# Patient Record
Sex: Female | Born: 1945 | Race: White | Hispanic: No | Marital: Married | Smoking: Never smoker
Health system: Southern US, Community
[De-identification: ages and names within clinical notes are randomized; demographics above are authoritative.]

## PROBLEM LIST (undated history)

## (undated) DIAGNOSIS — I639 Cerebral infarction, unspecified: Secondary | ICD-10-CM

## (undated) DIAGNOSIS — E039 Hypothyroidism, unspecified: Secondary | ICD-10-CM

## (undated) DIAGNOSIS — Z87820 Personal history of traumatic brain injury: Secondary | ICD-10-CM

## (undated) DIAGNOSIS — G43909 Migraine, unspecified, not intractable, without status migrainosus: Secondary | ICD-10-CM

## (undated) DIAGNOSIS — I10 Essential (primary) hypertension: Secondary | ICD-10-CM

## (undated) DIAGNOSIS — R5382 Chronic fatigue, unspecified: Secondary | ICD-10-CM

## (undated) DIAGNOSIS — G9332 Myalgic encephalomyelitis/chronic fatigue syndrome: Secondary | ICD-10-CM

## (undated) HISTORY — DX: Hypothyroidism, unspecified: E03.9

## (undated) HISTORY — DX: Myalgic encephalomyelitis/chronic fatigue syndrome: G93.32

## (undated) HISTORY — DX: Personal history of traumatic brain injury: Z87.820

## (undated) HISTORY — DX: Essential (primary) hypertension: I10

## (undated) HISTORY — DX: Chronic fatigue, unspecified: R53.82

---

## 1983-01-04 DIAGNOSIS — F411 Generalized anxiety disorder: Secondary | ICD-10-CM | POA: Insufficient documentation

## 2007-11-04 HISTORY — PX: BACK SURGERY: SHX140

## 2009-01-03 HISTORY — PX: REPLACEMENT TOTAL KNEE BILATERAL: SUR1225

## 2009-09-03 HISTORY — PX: CATARACT EXTRACTION, BILATERAL: SHX1313

## 2010-12-04 HISTORY — PX: ABDOMINAL HYSTERECTOMY: SHX81

## 2011-01-04 HISTORY — PX: ABDOMINAL HYSTERECTOMY: SHX81

## 2011-02-23 DIAGNOSIS — H43819 Vitreous degeneration, unspecified eye: Secondary | ICD-10-CM | POA: Diagnosis not present

## 2011-03-09 DIAGNOSIS — N951 Menopausal and female climacteric states: Secondary | ICD-10-CM | POA: Diagnosis not present

## 2011-03-11 DIAGNOSIS — E039 Hypothyroidism, unspecified: Secondary | ICD-10-CM | POA: Diagnosis not present

## 2011-03-24 DIAGNOSIS — F411 Generalized anxiety disorder: Secondary | ICD-10-CM | POA: Diagnosis not present

## 2011-03-24 DIAGNOSIS — I10 Essential (primary) hypertension: Secondary | ICD-10-CM | POA: Diagnosis not present

## 2011-03-24 DIAGNOSIS — E039 Hypothyroidism, unspecified: Secondary | ICD-10-CM | POA: Diagnosis not present

## 2011-03-31 DIAGNOSIS — R0789 Other chest pain: Secondary | ICD-10-CM | POA: Diagnosis not present

## 2011-03-31 DIAGNOSIS — I6350 Cerebral infarction due to unspecified occlusion or stenosis of unspecified cerebral artery: Secondary | ICD-10-CM | POA: Diagnosis not present

## 2011-03-31 DIAGNOSIS — E7800 Pure hypercholesterolemia, unspecified: Secondary | ICD-10-CM | POA: Diagnosis not present

## 2011-03-31 DIAGNOSIS — I10 Essential (primary) hypertension: Secondary | ICD-10-CM | POA: Diagnosis not present

## 2011-06-20 DIAGNOSIS — R262 Difficulty in walking, not elsewhere classified: Secondary | ICD-10-CM | POA: Diagnosis not present

## 2011-06-20 DIAGNOSIS — M25569 Pain in unspecified knee: Secondary | ICD-10-CM | POA: Diagnosis not present

## 2011-06-22 DIAGNOSIS — R262 Difficulty in walking, not elsewhere classified: Secondary | ICD-10-CM | POA: Diagnosis not present

## 2011-06-22 DIAGNOSIS — M25569 Pain in unspecified knee: Secondary | ICD-10-CM | POA: Diagnosis not present

## 2011-06-28 DIAGNOSIS — J069 Acute upper respiratory infection, unspecified: Secondary | ICD-10-CM | POA: Diagnosis not present

## 2011-06-28 DIAGNOSIS — N390 Urinary tract infection, site not specified: Secondary | ICD-10-CM | POA: Diagnosis not present

## 2011-06-28 DIAGNOSIS — R300 Dysuria: Secondary | ICD-10-CM | POA: Diagnosis not present

## 2011-07-06 DIAGNOSIS — N390 Urinary tract infection, site not specified: Secondary | ICD-10-CM | POA: Diagnosis not present

## 2011-07-06 DIAGNOSIS — J069 Acute upper respiratory infection, unspecified: Secondary | ICD-10-CM | POA: Diagnosis not present

## 2011-08-10 DIAGNOSIS — N390 Urinary tract infection, site not specified: Secondary | ICD-10-CM | POA: Diagnosis not present

## 2011-08-10 DIAGNOSIS — F411 Generalized anxiety disorder: Secondary | ICD-10-CM | POA: Diagnosis not present

## 2011-08-10 DIAGNOSIS — R300 Dysuria: Secondary | ICD-10-CM | POA: Diagnosis not present

## 2011-08-11 DIAGNOSIS — Z981 Arthrodesis status: Secondary | ICD-10-CM | POA: Diagnosis not present

## 2011-08-11 DIAGNOSIS — M545 Low back pain: Secondary | ICD-10-CM | POA: Diagnosis not present

## 2011-08-11 DIAGNOSIS — M79609 Pain in unspecified limb: Secondary | ICD-10-CM | POA: Diagnosis not present

## 2011-08-15 DIAGNOSIS — N390 Urinary tract infection, site not specified: Secondary | ICD-10-CM | POA: Diagnosis not present

## 2011-08-16 DIAGNOSIS — M5126 Other intervertebral disc displacement, lumbar region: Secondary | ICD-10-CM | POA: Diagnosis not present

## 2011-08-16 DIAGNOSIS — M5137 Other intervertebral disc degeneration, lumbosacral region: Secondary | ICD-10-CM | POA: Diagnosis not present

## 2011-08-16 DIAGNOSIS — M47817 Spondylosis without myelopathy or radiculopathy, lumbosacral region: Secondary | ICD-10-CM | POA: Diagnosis not present

## 2011-09-09 DIAGNOSIS — M5137 Other intervertebral disc degeneration, lumbosacral region: Secondary | ICD-10-CM | POA: Diagnosis not present

## 2011-09-16 DIAGNOSIS — I10 Essential (primary) hypertension: Secondary | ICD-10-CM | POA: Diagnosis not present

## 2011-09-16 DIAGNOSIS — I6350 Cerebral infarction due to unspecified occlusion or stenosis of unspecified cerebral artery: Secondary | ICD-10-CM | POA: Diagnosis not present

## 2011-09-16 DIAGNOSIS — E7800 Pure hypercholesterolemia, unspecified: Secondary | ICD-10-CM | POA: Diagnosis not present

## 2011-09-16 DIAGNOSIS — R0789 Other chest pain: Secondary | ICD-10-CM | POA: Diagnosis not present

## 2011-09-16 DIAGNOSIS — R002 Palpitations: Secondary | ICD-10-CM | POA: Diagnosis not present

## 2011-09-19 DIAGNOSIS — N390 Urinary tract infection, site not specified: Secondary | ICD-10-CM | POA: Diagnosis not present

## 2011-09-19 DIAGNOSIS — R300 Dysuria: Secondary | ICD-10-CM | POA: Diagnosis not present

## 2011-09-20 DIAGNOSIS — N390 Urinary tract infection, site not specified: Secondary | ICD-10-CM | POA: Diagnosis not present

## 2011-09-21 DIAGNOSIS — N390 Urinary tract infection, site not specified: Secondary | ICD-10-CM | POA: Diagnosis not present

## 2011-10-12 DIAGNOSIS — R339 Retention of urine, unspecified: Secondary | ICD-10-CM | POA: Diagnosis not present

## 2011-10-12 DIAGNOSIS — R350 Frequency of micturition: Secondary | ICD-10-CM | POA: Diagnosis not present

## 2011-10-12 DIAGNOSIS — N390 Urinary tract infection, site not specified: Secondary | ICD-10-CM | POA: Diagnosis not present

## 2011-10-17 DIAGNOSIS — R350 Frequency of micturition: Secondary | ICD-10-CM | POA: Diagnosis not present

## 2011-10-17 DIAGNOSIS — R351 Nocturia: Secondary | ICD-10-CM | POA: Diagnosis not present

## 2011-10-17 DIAGNOSIS — R339 Retention of urine, unspecified: Secondary | ICD-10-CM | POA: Diagnosis not present

## 2011-10-17 DIAGNOSIS — N390 Urinary tract infection, site not specified: Secondary | ICD-10-CM | POA: Diagnosis not present

## 2011-11-16 DIAGNOSIS — R339 Retention of urine, unspecified: Secondary | ICD-10-CM | POA: Diagnosis not present

## 2011-11-16 DIAGNOSIS — N390 Urinary tract infection, site not specified: Secondary | ICD-10-CM | POA: Diagnosis not present

## 2011-12-16 DIAGNOSIS — N390 Urinary tract infection, site not specified: Secondary | ICD-10-CM | POA: Diagnosis not present

## 2012-01-18 DIAGNOSIS — N390 Urinary tract infection, site not specified: Secondary | ICD-10-CM | POA: Diagnosis not present

## 2012-01-18 DIAGNOSIS — R339 Retention of urine, unspecified: Secondary | ICD-10-CM | POA: Diagnosis not present

## 2012-02-16 DIAGNOSIS — E039 Hypothyroidism, unspecified: Secondary | ICD-10-CM | POA: Diagnosis not present

## 2012-02-16 DIAGNOSIS — F411 Generalized anxiety disorder: Secondary | ICD-10-CM | POA: Diagnosis not present

## 2012-03-22 DIAGNOSIS — E039 Hypothyroidism, unspecified: Secondary | ICD-10-CM | POA: Diagnosis not present

## 2012-03-22 DIAGNOSIS — I10 Essential (primary) hypertension: Secondary | ICD-10-CM | POA: Diagnosis not present

## 2012-03-26 DIAGNOSIS — N390 Urinary tract infection, site not specified: Secondary | ICD-10-CM | POA: Diagnosis not present

## 2012-03-26 DIAGNOSIS — E039 Hypothyroidism, unspecified: Secondary | ICD-10-CM | POA: Diagnosis not present

## 2012-04-05 DIAGNOSIS — R0609 Other forms of dyspnea: Secondary | ICD-10-CM | POA: Diagnosis not present

## 2012-04-05 DIAGNOSIS — E871 Hypo-osmolality and hyponatremia: Secondary | ICD-10-CM | POA: Diagnosis not present

## 2012-04-05 DIAGNOSIS — R5383 Other fatigue: Secondary | ICD-10-CM | POA: Diagnosis not present

## 2012-04-05 DIAGNOSIS — E039 Hypothyroidism, unspecified: Secondary | ICD-10-CM | POA: Diagnosis not present

## 2012-04-05 DIAGNOSIS — I10 Essential (primary) hypertension: Secondary | ICD-10-CM | POA: Diagnosis not present

## 2012-04-05 DIAGNOSIS — R5381 Other malaise: Secondary | ICD-10-CM | POA: Diagnosis not present

## 2012-04-05 DIAGNOSIS — R0989 Other specified symptoms and signs involving the circulatory and respiratory systems: Secondary | ICD-10-CM | POA: Diagnosis not present

## 2012-04-19 DIAGNOSIS — K06020 Generalized gingival recession, unspecified: Secondary | ICD-10-CM | POA: Diagnosis not present

## 2012-04-19 DIAGNOSIS — R5381 Other malaise: Secondary | ICD-10-CM | POA: Diagnosis not present

## 2012-04-19 DIAGNOSIS — G4733 Obstructive sleep apnea (adult) (pediatric): Secondary | ICD-10-CM | POA: Diagnosis not present

## 2012-04-19 DIAGNOSIS — Z87440 Personal history of urinary (tract) infections: Secondary | ICD-10-CM | POA: Diagnosis not present

## 2012-04-19 DIAGNOSIS — E039 Hypothyroidism, unspecified: Secondary | ICD-10-CM | POA: Diagnosis not present

## 2012-04-19 DIAGNOSIS — I10 Essential (primary) hypertension: Secondary | ICD-10-CM | POA: Diagnosis not present

## 2012-04-19 DIAGNOSIS — R5383 Other fatigue: Secondary | ICD-10-CM | POA: Diagnosis not present

## 2012-04-30 DIAGNOSIS — I10 Essential (primary) hypertension: Secondary | ICD-10-CM | POA: Diagnosis not present

## 2012-05-03 DIAGNOSIS — Z87820 Personal history of traumatic brain injury: Secondary | ICD-10-CM

## 2012-05-03 HISTORY — DX: Personal history of traumatic brain injury: Z87.820

## 2012-05-09 DIAGNOSIS — N390 Urinary tract infection, site not specified: Secondary | ICD-10-CM | POA: Diagnosis not present

## 2012-05-14 DIAGNOSIS — S0180XA Unspecified open wound of other part of head, initial encounter: Secondary | ICD-10-CM | POA: Diagnosis not present

## 2012-05-14 DIAGNOSIS — R51 Headache: Secondary | ICD-10-CM | POA: Diagnosis not present

## 2012-05-14 DIAGNOSIS — Z5320 Procedure and treatment not carried out because of patient's decision for unspecified reasons: Secondary | ICD-10-CM | POA: Diagnosis not present

## 2012-05-14 DIAGNOSIS — S0280XA Fracture of other specified skull and facial bones, unspecified side, initial encounter for closed fracture: Secondary | ICD-10-CM | POA: Diagnosis not present

## 2012-05-14 DIAGNOSIS — S0003XA Contusion of scalp, initial encounter: Secondary | ICD-10-CM | POA: Diagnosis not present

## 2012-05-14 DIAGNOSIS — S1093XA Contusion of unspecified part of neck, initial encounter: Secondary | ICD-10-CM | POA: Diagnosis not present

## 2012-05-14 DIAGNOSIS — T148XXA Other injury of unspecified body region, initial encounter: Secondary | ICD-10-CM | POA: Diagnosis not present

## 2012-05-15 DIAGNOSIS — S0100XA Unspecified open wound of scalp, initial encounter: Secondary | ICD-10-CM | POA: Diagnosis not present

## 2012-05-15 DIAGNOSIS — Z23 Encounter for immunization: Secondary | ICD-10-CM | POA: Diagnosis not present

## 2012-05-15 DIAGNOSIS — W010XXA Fall on same level from slipping, tripping and stumbling without subsequent striking against object, initial encounter: Secondary | ICD-10-CM | POA: Diagnosis not present

## 2012-05-15 DIAGNOSIS — S020XXA Fracture of vault of skull, initial encounter for closed fracture: Secondary | ICD-10-CM | POA: Diagnosis not present

## 2012-05-31 DIAGNOSIS — W010XXA Fall on same level from slipping, tripping and stumbling without subsequent striking against object, initial encounter: Secondary | ICD-10-CM | POA: Diagnosis not present

## 2012-05-31 DIAGNOSIS — S020XXA Fracture of vault of skull, initial encounter for closed fracture: Secondary | ICD-10-CM | POA: Diagnosis not present

## 2012-06-06 DIAGNOSIS — S020XXA Fracture of vault of skull, initial encounter for closed fracture: Secondary | ICD-10-CM | POA: Diagnosis not present

## 2012-06-06 DIAGNOSIS — R51 Headache: Secondary | ICD-10-CM | POA: Diagnosis not present

## 2012-06-06 DIAGNOSIS — I10 Essential (primary) hypertension: Secondary | ICD-10-CM | POA: Diagnosis not present

## 2012-06-06 DIAGNOSIS — R5383 Other fatigue: Secondary | ICD-10-CM | POA: Diagnosis not present

## 2012-06-06 DIAGNOSIS — R5381 Other malaise: Secondary | ICD-10-CM | POA: Diagnosis not present

## 2012-06-06 DIAGNOSIS — F0781 Postconcussional syndrome: Secondary | ICD-10-CM | POA: Diagnosis not present

## 2012-06-13 DIAGNOSIS — R51 Headache: Secondary | ICD-10-CM | POA: Diagnosis not present

## 2012-06-20 DIAGNOSIS — F0781 Postconcussional syndrome: Secondary | ICD-10-CM | POA: Diagnosis not present

## 2012-06-20 DIAGNOSIS — I10 Essential (primary) hypertension: Secondary | ICD-10-CM | POA: Diagnosis not present

## 2012-06-20 DIAGNOSIS — R5381 Other malaise: Secondary | ICD-10-CM | POA: Diagnosis not present

## 2012-06-20 DIAGNOSIS — S020XXA Fracture of vault of skull, initial encounter for closed fracture: Secondary | ICD-10-CM | POA: Diagnosis not present

## 2012-07-04 DIAGNOSIS — E559 Vitamin D deficiency, unspecified: Secondary | ICD-10-CM | POA: Diagnosis not present

## 2012-07-04 DIAGNOSIS — E785 Hyperlipidemia, unspecified: Secondary | ICD-10-CM | POA: Diagnosis not present

## 2012-07-04 DIAGNOSIS — I10 Essential (primary) hypertension: Secondary | ICD-10-CM | POA: Diagnosis not present

## 2012-07-04 DIAGNOSIS — E039 Hypothyroidism, unspecified: Secondary | ICD-10-CM | POA: Diagnosis not present

## 2012-07-04 DIAGNOSIS — R300 Dysuria: Secondary | ICD-10-CM | POA: Diagnosis not present

## 2012-07-25 DIAGNOSIS — N390 Urinary tract infection, site not specified: Secondary | ICD-10-CM | POA: Diagnosis not present

## 2012-07-25 DIAGNOSIS — R339 Retention of urine, unspecified: Secondary | ICD-10-CM | POA: Diagnosis not present

## 2012-09-17 DIAGNOSIS — F0781 Postconcussional syndrome: Secondary | ICD-10-CM | POA: Diagnosis not present

## 2012-09-17 DIAGNOSIS — G43009 Migraine without aura, not intractable, without status migrainosus: Secondary | ICD-10-CM | POA: Diagnosis not present

## 2012-09-17 DIAGNOSIS — R5382 Chronic fatigue, unspecified: Secondary | ICD-10-CM | POA: Diagnosis not present

## 2012-09-27 DIAGNOSIS — H26499 Other secondary cataract, unspecified eye: Secondary | ICD-10-CM | POA: Diagnosis not present

## 2012-09-27 DIAGNOSIS — G43809 Other migraine, not intractable, without status migrainosus: Secondary | ICD-10-CM | POA: Diagnosis not present

## 2012-09-27 DIAGNOSIS — H43819 Vitreous degeneration, unspecified eye: Secondary | ICD-10-CM | POA: Diagnosis not present

## 2012-09-27 DIAGNOSIS — H40019 Open angle with borderline findings, low risk, unspecified eye: Secondary | ICD-10-CM | POA: Diagnosis not present

## 2012-10-02 DIAGNOSIS — E039 Hypothyroidism, unspecified: Secondary | ICD-10-CM | POA: Diagnosis not present

## 2012-10-02 DIAGNOSIS — I10 Essential (primary) hypertension: Secondary | ICD-10-CM | POA: Diagnosis not present

## 2012-10-02 DIAGNOSIS — R300 Dysuria: Secondary | ICD-10-CM | POA: Diagnosis not present

## 2012-10-02 DIAGNOSIS — E785 Hyperlipidemia, unspecified: Secondary | ICD-10-CM | POA: Diagnosis not present

## 2012-10-02 DIAGNOSIS — E559 Vitamin D deficiency, unspecified: Secondary | ICD-10-CM | POA: Diagnosis not present

## 2012-10-16 DIAGNOSIS — H26499 Other secondary cataract, unspecified eye: Secondary | ICD-10-CM | POA: Diagnosis not present

## 2012-10-23 DIAGNOSIS — H26499 Other secondary cataract, unspecified eye: Secondary | ICD-10-CM | POA: Diagnosis not present

## 2012-11-08 DIAGNOSIS — H04129 Dry eye syndrome of unspecified lacrimal gland: Secondary | ICD-10-CM | POA: Diagnosis not present

## 2012-11-08 DIAGNOSIS — H01009 Unspecified blepharitis unspecified eye, unspecified eyelid: Secondary | ICD-10-CM | POA: Diagnosis not present

## 2012-11-22 DIAGNOSIS — H04129 Dry eye syndrome of unspecified lacrimal gland: Secondary | ICD-10-CM | POA: Diagnosis not present

## 2013-01-23 DIAGNOSIS — R339 Retention of urine, unspecified: Secondary | ICD-10-CM | POA: Diagnosis not present

## 2013-01-23 DIAGNOSIS — N390 Urinary tract infection, site not specified: Secondary | ICD-10-CM | POA: Diagnosis not present

## 2013-02-04 DIAGNOSIS — N952 Postmenopausal atrophic vaginitis: Secondary | ICD-10-CM | POA: Diagnosis not present

## 2013-02-04 DIAGNOSIS — N951 Menopausal and female climacteric states: Secondary | ICD-10-CM | POA: Diagnosis not present

## 2013-04-04 DIAGNOSIS — R5383 Other fatigue: Secondary | ICD-10-CM | POA: Diagnosis not present

## 2013-04-04 DIAGNOSIS — I10 Essential (primary) hypertension: Secondary | ICD-10-CM | POA: Diagnosis not present

## 2013-04-04 DIAGNOSIS — R5381 Other malaise: Secondary | ICD-10-CM | POA: Diagnosis not present

## 2013-04-04 DIAGNOSIS — R0609 Other forms of dyspnea: Secondary | ICD-10-CM | POA: Diagnosis not present

## 2013-04-04 DIAGNOSIS — E785 Hyperlipidemia, unspecified: Secondary | ICD-10-CM | POA: Diagnosis not present

## 2013-04-04 DIAGNOSIS — N390 Urinary tract infection, site not specified: Secondary | ICD-10-CM | POA: Diagnosis not present

## 2013-04-04 DIAGNOSIS — E039 Hypothyroidism, unspecified: Secondary | ICD-10-CM | POA: Diagnosis not present

## 2013-04-09 DIAGNOSIS — N390 Urinary tract infection, site not specified: Secondary | ICD-10-CM | POA: Diagnosis not present

## 2013-04-09 DIAGNOSIS — I10 Essential (primary) hypertension: Secondary | ICD-10-CM | POA: Diagnosis not present

## 2013-04-09 DIAGNOSIS — E039 Hypothyroidism, unspecified: Secondary | ICD-10-CM | POA: Diagnosis not present

## 2013-04-09 DIAGNOSIS — E785 Hyperlipidemia, unspecified: Secondary | ICD-10-CM | POA: Diagnosis not present

## 2013-04-10 DIAGNOSIS — H04129 Dry eye syndrome of unspecified lacrimal gland: Secondary | ICD-10-CM | POA: Diagnosis not present

## 2013-04-10 DIAGNOSIS — H40019 Open angle with borderline findings, low risk, unspecified eye: Secondary | ICD-10-CM | POA: Diagnosis not present

## 2013-04-26 DIAGNOSIS — N390 Urinary tract infection, site not specified: Secondary | ICD-10-CM | POA: Diagnosis not present

## 2013-09-03 DIAGNOSIS — N189 Chronic kidney disease, unspecified: Secondary | ICD-10-CM | POA: Diagnosis not present

## 2013-09-10 DIAGNOSIS — N390 Urinary tract infection, site not specified: Secondary | ICD-10-CM | POA: Diagnosis not present

## 2013-12-02 DIAGNOSIS — I10 Essential (primary) hypertension: Secondary | ICD-10-CM | POA: Diagnosis not present

## 2013-12-02 DIAGNOSIS — R5382 Chronic fatigue, unspecified: Secondary | ICD-10-CM | POA: Diagnosis not present

## 2013-12-02 DIAGNOSIS — F329 Major depressive disorder, single episode, unspecified: Secondary | ICD-10-CM | POA: Diagnosis not present

## 2013-12-02 DIAGNOSIS — G43909 Migraine, unspecified, not intractable, without status migrainosus: Secondary | ICD-10-CM | POA: Diagnosis not present

## 2013-12-09 DIAGNOSIS — R5382 Chronic fatigue, unspecified: Secondary | ICD-10-CM | POA: Diagnosis not present

## 2013-12-09 DIAGNOSIS — G629 Polyneuropathy, unspecified: Secondary | ICD-10-CM | POA: Diagnosis not present

## 2013-12-09 DIAGNOSIS — I10 Essential (primary) hypertension: Secondary | ICD-10-CM | POA: Diagnosis not present

## 2013-12-09 DIAGNOSIS — F329 Major depressive disorder, single episode, unspecified: Secondary | ICD-10-CM | POA: Diagnosis not present

## 2013-12-09 DIAGNOSIS — Z79899 Other long term (current) drug therapy: Secondary | ICD-10-CM | POA: Diagnosis not present

## 2013-12-13 DIAGNOSIS — E871 Hypo-osmolality and hyponatremia: Secondary | ICD-10-CM | POA: Diagnosis not present

## 2013-12-16 DIAGNOSIS — E871 Hypo-osmolality and hyponatremia: Secondary | ICD-10-CM | POA: Diagnosis not present

## 2013-12-24 DIAGNOSIS — I10 Essential (primary) hypertension: Secondary | ICD-10-CM | POA: Diagnosis not present

## 2013-12-24 DIAGNOSIS — F329 Major depressive disorder, single episode, unspecified: Secondary | ICD-10-CM | POA: Diagnosis not present

## 2013-12-24 DIAGNOSIS — I639 Cerebral infarction, unspecified: Secondary | ICD-10-CM | POA: Diagnosis not present

## 2014-01-06 DIAGNOSIS — E780 Pure hypercholesterolemia: Secondary | ICD-10-CM | POA: Diagnosis not present

## 2014-01-06 DIAGNOSIS — I10 Essential (primary) hypertension: Secondary | ICD-10-CM | POA: Diagnosis not present

## 2014-01-09 DIAGNOSIS — F410 Panic disorder [episodic paroxysmal anxiety] without agoraphobia: Secondary | ICD-10-CM | POA: Diagnosis not present

## 2014-01-10 DIAGNOSIS — E871 Hypo-osmolality and hyponatremia: Secondary | ICD-10-CM | POA: Diagnosis not present

## 2014-12-03 DIAGNOSIS — R0602 Shortness of breath: Secondary | ICD-10-CM | POA: Insufficient documentation

## 2014-12-03 DIAGNOSIS — R0609 Other forms of dyspnea: Secondary | ICD-10-CM | POA: Insufficient documentation

## 2015-01-19 DIAGNOSIS — J387 Other diseases of larynx: Secondary | ICD-10-CM | POA: Diagnosis not present

## 2015-01-19 DIAGNOSIS — I10 Essential (primary) hypertension: Secondary | ICD-10-CM | POA: Diagnosis not present

## 2015-02-04 DIAGNOSIS — Z13220 Encounter for screening for lipoid disorders: Secondary | ICD-10-CM | POA: Diagnosis not present

## 2015-02-05 DIAGNOSIS — I69959 Hemiplegia and hemiparesis following unspecified cerebrovascular disease affecting unspecified side: Secondary | ICD-10-CM | POA: Insufficient documentation

## 2015-02-05 DIAGNOSIS — E7800 Pure hypercholesterolemia, unspecified: Secondary | ICD-10-CM | POA: Insufficient documentation

## 2015-02-05 DIAGNOSIS — E039 Hypothyroidism, unspecified: Secondary | ICD-10-CM | POA: Insufficient documentation

## 2015-02-11 DIAGNOSIS — G43109 Migraine with aura, not intractable, without status migrainosus: Secondary | ICD-10-CM | POA: Insufficient documentation

## 2015-02-11 DIAGNOSIS — F419 Anxiety disorder, unspecified: Secondary | ICD-10-CM | POA: Insufficient documentation

## 2015-02-12 DIAGNOSIS — F419 Anxiety disorder, unspecified: Secondary | ICD-10-CM | POA: Diagnosis not present

## 2015-02-12 DIAGNOSIS — E7800 Pure hypercholesterolemia, unspecified: Secondary | ICD-10-CM | POA: Diagnosis not present

## 2015-02-12 DIAGNOSIS — I10 Essential (primary) hypertension: Secondary | ICD-10-CM | POA: Diagnosis not present

## 2015-02-12 DIAGNOSIS — J302 Other seasonal allergic rhinitis: Secondary | ICD-10-CM | POA: Diagnosis not present

## 2015-06-10 DIAGNOSIS — M7581 Other shoulder lesions, right shoulder: Secondary | ICD-10-CM | POA: Diagnosis not present

## 2015-06-24 DIAGNOSIS — M25511 Pain in right shoulder: Secondary | ICD-10-CM | POA: Diagnosis not present

## 2015-07-03 DIAGNOSIS — M6281 Muscle weakness (generalized): Secondary | ICD-10-CM | POA: Diagnosis not present

## 2015-07-03 DIAGNOSIS — M25511 Pain in right shoulder: Secondary | ICD-10-CM | POA: Diagnosis not present

## 2015-07-14 DIAGNOSIS — M25511 Pain in right shoulder: Secondary | ICD-10-CM | POA: Diagnosis not present

## 2015-07-14 DIAGNOSIS — M6281 Muscle weakness (generalized): Secondary | ICD-10-CM | POA: Diagnosis not present

## 2015-07-17 DIAGNOSIS — M25511 Pain in right shoulder: Secondary | ICD-10-CM | POA: Diagnosis not present

## 2015-07-17 DIAGNOSIS — M6281 Muscle weakness (generalized): Secondary | ICD-10-CM | POA: Diagnosis not present

## 2015-07-21 DIAGNOSIS — M25511 Pain in right shoulder: Secondary | ICD-10-CM | POA: Diagnosis not present

## 2015-07-21 DIAGNOSIS — M6281 Muscle weakness (generalized): Secondary | ICD-10-CM | POA: Diagnosis not present

## 2015-07-27 DIAGNOSIS — M6281 Muscle weakness (generalized): Secondary | ICD-10-CM | POA: Diagnosis not present

## 2015-07-27 DIAGNOSIS — M25511 Pain in right shoulder: Secondary | ICD-10-CM | POA: Diagnosis not present

## 2015-07-30 DIAGNOSIS — M6281 Muscle weakness (generalized): Secondary | ICD-10-CM | POA: Diagnosis not present

## 2015-07-30 DIAGNOSIS — M25511 Pain in right shoulder: Secondary | ICD-10-CM | POA: Diagnosis not present

## 2015-08-04 DIAGNOSIS — M6281 Muscle weakness (generalized): Secondary | ICD-10-CM | POA: Diagnosis not present

## 2015-08-04 DIAGNOSIS — M25511 Pain in right shoulder: Secondary | ICD-10-CM | POA: Diagnosis not present

## 2015-08-12 DIAGNOSIS — M6281 Muscle weakness (generalized): Secondary | ICD-10-CM | POA: Diagnosis not present

## 2015-08-12 DIAGNOSIS — M25511 Pain in right shoulder: Secondary | ICD-10-CM | POA: Diagnosis not present

## 2015-08-18 DIAGNOSIS — M6281 Muscle weakness (generalized): Secondary | ICD-10-CM | POA: Diagnosis not present

## 2015-08-18 DIAGNOSIS — M25511 Pain in right shoulder: Secondary | ICD-10-CM | POA: Diagnosis not present

## 2015-08-25 DIAGNOSIS — M6281 Muscle weakness (generalized): Secondary | ICD-10-CM | POA: Diagnosis not present

## 2015-08-25 DIAGNOSIS — M25511 Pain in right shoulder: Secondary | ICD-10-CM | POA: Diagnosis not present

## 2015-09-01 DIAGNOSIS — M25511 Pain in right shoulder: Secondary | ICD-10-CM | POA: Diagnosis not present

## 2015-09-01 DIAGNOSIS — M6281 Muscle weakness (generalized): Secondary | ICD-10-CM | POA: Diagnosis not present

## 2015-09-02 DIAGNOSIS — H40003 Preglaucoma, unspecified, bilateral: Secondary | ICD-10-CM | POA: Diagnosis not present

## 2015-09-08 DIAGNOSIS — M25511 Pain in right shoulder: Secondary | ICD-10-CM | POA: Diagnosis not present

## 2015-09-08 DIAGNOSIS — M6281 Muscle weakness (generalized): Secondary | ICD-10-CM | POA: Diagnosis not present

## 2015-09-23 DIAGNOSIS — M25511 Pain in right shoulder: Secondary | ICD-10-CM | POA: Diagnosis not present

## 2015-09-23 DIAGNOSIS — M6281 Muscle weakness (generalized): Secondary | ICD-10-CM | POA: Diagnosis not present

## 2015-10-13 DIAGNOSIS — M6281 Muscle weakness (generalized): Secondary | ICD-10-CM | POA: Diagnosis not present

## 2015-10-13 DIAGNOSIS — M25511 Pain in right shoulder: Secondary | ICD-10-CM | POA: Diagnosis not present

## 2015-10-21 DIAGNOSIS — M25511 Pain in right shoulder: Secondary | ICD-10-CM | POA: Diagnosis not present

## 2015-10-21 DIAGNOSIS — M6281 Muscle weakness (generalized): Secondary | ICD-10-CM | POA: Diagnosis not present

## 2015-10-22 DIAGNOSIS — H9121 Sudden idiopathic hearing loss, right ear: Secondary | ICD-10-CM | POA: Diagnosis not present

## 2015-10-22 DIAGNOSIS — J342 Deviated nasal septum: Secondary | ICD-10-CM | POA: Diagnosis not present

## 2015-10-22 DIAGNOSIS — H9313 Tinnitus, bilateral: Secondary | ICD-10-CM | POA: Diagnosis not present

## 2015-10-22 DIAGNOSIS — I10 Essential (primary) hypertension: Secondary | ICD-10-CM | POA: Diagnosis not present

## 2015-10-22 DIAGNOSIS — H9191 Unspecified hearing loss, right ear: Secondary | ICD-10-CM | POA: Diagnosis not present

## 2015-10-22 DIAGNOSIS — H6122 Impacted cerumen, left ear: Secondary | ICD-10-CM | POA: Diagnosis not present

## 2015-10-22 DIAGNOSIS — H903 Sensorineural hearing loss, bilateral: Secondary | ICD-10-CM | POA: Diagnosis not present

## 2015-10-22 DIAGNOSIS — H905 Unspecified sensorineural hearing loss: Secondary | ICD-10-CM | POA: Diagnosis not present

## 2015-11-03 DIAGNOSIS — M25511 Pain in right shoulder: Secondary | ICD-10-CM | POA: Diagnosis not present

## 2015-11-03 DIAGNOSIS — M6281 Muscle weakness (generalized): Secondary | ICD-10-CM | POA: Diagnosis not present

## 2015-11-05 DIAGNOSIS — H9313 Tinnitus, bilateral: Secondary | ICD-10-CM | POA: Diagnosis not present

## 2015-11-05 DIAGNOSIS — H903 Sensorineural hearing loss, bilateral: Secondary | ICD-10-CM | POA: Diagnosis not present

## 2015-11-05 DIAGNOSIS — H9201 Otalgia, right ear: Secondary | ICD-10-CM | POA: Diagnosis not present

## 2015-11-05 DIAGNOSIS — H9121 Sudden idiopathic hearing loss, right ear: Secondary | ICD-10-CM | POA: Diagnosis not present

## 2015-11-08 DIAGNOSIS — K219 Gastro-esophageal reflux disease without esophagitis: Secondary | ICD-10-CM | POA: Insufficient documentation

## 2015-11-09 DIAGNOSIS — I10 Essential (primary) hypertension: Secondary | ICD-10-CM | POA: Diagnosis not present

## 2015-11-09 DIAGNOSIS — E7800 Pure hypercholesterolemia, unspecified: Secondary | ICD-10-CM | POA: Diagnosis not present

## 2015-11-09 DIAGNOSIS — Z131 Encounter for screening for diabetes mellitus: Secondary | ICD-10-CM | POA: Diagnosis not present

## 2015-11-09 DIAGNOSIS — E039 Hypothyroidism, unspecified: Secondary | ICD-10-CM | POA: Diagnosis not present

## 2015-11-09 DIAGNOSIS — F419 Anxiety disorder, unspecified: Secondary | ICD-10-CM | POA: Diagnosis not present

## 2016-03-19 ENCOUNTER — Emergency Department (HOSPITAL_COMMUNITY): Payer: PPO

## 2016-03-19 ENCOUNTER — Encounter (HOSPITAL_COMMUNITY): Payer: Self-pay

## 2016-03-19 ENCOUNTER — Emergency Department (HOSPITAL_COMMUNITY)
Admission: EM | Admit: 2016-03-19 | Discharge: 2016-03-19 | Disposition: A | Discharge status: Home / Self Care | Payer: PPO | Attending: Emergency Medicine | Admitting: Emergency Medicine

## 2016-03-19 DIAGNOSIS — Z8673 Personal history of transient ischemic attack (TIA), and cerebral infarction without residual deficits: Secondary | ICD-10-CM | POA: Insufficient documentation

## 2016-03-19 DIAGNOSIS — I10 Essential (primary) hypertension: Secondary | ICD-10-CM

## 2016-03-19 DIAGNOSIS — Z96653 Presence of artificial knee joint, bilateral: Secondary | ICD-10-CM | POA: Insufficient documentation

## 2016-03-19 DIAGNOSIS — Z79899 Other long term (current) drug therapy: Secondary | ICD-10-CM | POA: Insufficient documentation

## 2016-03-19 DIAGNOSIS — R200 Anesthesia of skin: Secondary | ICD-10-CM | POA: Diagnosis not present

## 2016-03-19 DIAGNOSIS — R791 Abnormal coagulation profile: Secondary | ICD-10-CM | POA: Insufficient documentation

## 2016-03-19 DIAGNOSIS — N3000 Acute cystitis without hematuria: Secondary | ICD-10-CM | POA: Diagnosis not present

## 2016-03-19 HISTORY — DX: Migraine, unspecified, not intractable, without status migrainosus: G43.909

## 2016-03-19 HISTORY — DX: Cerebral infarction, unspecified: I63.9

## 2016-03-19 LAB — COMPREHENSIVE METABOLIC PANEL
ALBUMIN: 4.3 g/dL (ref 3.5–5.0)
ALK PHOS: 77 U/L (ref 38–126)
ALT: 17 U/L (ref 14–54)
AST: 23 U/L (ref 15–41)
Anion gap: 8 (ref 5–15)
BILIRUBIN TOTAL: 1 mg/dL (ref 0.3–1.2)
BUN: 6 mg/dL (ref 6–20)
CALCIUM: 10 mg/dL (ref 8.9–10.3)
CO2: 29 mmol/L (ref 22–32)
CREATININE: 0.73 mg/dL (ref 0.44–1.00)
Chloride: 94 mmol/L — ABNORMAL LOW (ref 101–111)
GFR calc Af Amer: >60 mL/min (ref >60)
GFR calc non Af Amer: >60 mL/min (ref >60)
GLUCOSE: 111 mg/dL — AB (ref 65–99)
Potassium: 4.3 mmol/L (ref 3.5–5.1)
Sodium: 131 mmol/L — ABNORMAL LOW (ref 135–145)
TOTAL PROTEIN: 7.4 g/dL (ref 6.5–8.1)

## 2016-03-19 LAB — URINALYSIS, ROUTINE W REFLEX MICROSCOPIC
Bilirubin Urine: NEGATIVE
Glucose, UA: NEGATIVE mg/dL
Hgb urine dipstick: NEGATIVE
Ketones, ur: NEGATIVE mg/dL
Nitrite: NEGATIVE
PROTEIN: NEGATIVE mg/dL
SQUAMOUS EPITHELIAL / LPF: NONE SEEN
Specific Gravity, Urine: 1.009 (ref 1.005–1.030)
pH: 6 (ref 5.0–8.0)

## 2016-03-19 LAB — DIFFERENTIAL
BASOS ABS: 0 10*3/uL (ref 0.0–0.1)
Basophils Relative: 0 %
Eosinophils Absolute: 0.1 10*3/uL (ref 0.0–0.7)
Eosinophils Relative: 1 %
LYMPHS ABS: 1.2 10*3/uL (ref 0.7–4.0)
LYMPHS PCT: 17 %
Monocytes Absolute: 0.7 10*3/uL (ref 0.1–1.0)
Monocytes Relative: 9 %
NEUTROS PCT: 73 %
Neutro Abs: 5.1 10*3/uL (ref 1.7–7.7)

## 2016-03-19 LAB — CBC
HEMATOCRIT: 41.3 % (ref 36.0–46.0)
Hemoglobin: 14.6 g/dL (ref 12.0–15.0)
MCH: 31.2 pg (ref 26.0–34.0)
MCHC: 35.4 g/dL (ref 30.0–36.0)
MCV: 88.2 fL (ref 78.0–100.0)
Platelets: 210 10*3/uL (ref 150–400)
RBC: 4.68 MIL/uL (ref 3.87–5.11)
RDW: 14.2 % (ref 11.5–15.5)
WBC: 7 10*3/uL (ref 4.0–10.5)

## 2016-03-19 LAB — PROTIME-INR
INR: 0.91
Prothrombin Time: 12.3 seconds (ref 11.4–15.2)

## 2016-03-19 LAB — I-STAT CHEM 8, ED
BUN: 9 mg/dL (ref 6–20)
CALCIUM ION: 1.24 mmol/L (ref 1.15–1.40)
CHLORIDE: 93 mmol/L — AB (ref 101–111)
Creatinine, Ser: 0.7 mg/dL (ref 0.44–1.00)
GLUCOSE: 108 mg/dL — AB (ref 65–99)
HCT: 45 % (ref 36.0–46.0)
Hemoglobin: 15.3 g/dL — ABNORMAL HIGH (ref 12.0–15.0)
Potassium: 4.3 mmol/L (ref 3.5–5.1)
SODIUM: 132 mmol/L — AB (ref 135–145)
TCO2: 30 mmol/L (ref 0–100)

## 2016-03-19 LAB — CBG MONITORING, ED: Glucose-Capillary: 112 mg/dL — ABNORMAL HIGH (ref 65–99)

## 2016-03-19 LAB — RAPID URINE DRUG SCREEN, HOSP PERFORMED
Amphetamines: NOT DETECTED
Barbiturates: NOT DETECTED
Benzodiazepines: POSITIVE — AB
Cocaine: NOT DETECTED
OPIATES: NOT DETECTED
Tetrahydrocannabinol: NOT DETECTED

## 2016-03-19 LAB — I-STAT TROPONIN, ED: Troponin i, poc: 0 ng/mL (ref 0.00–0.08)

## 2016-03-19 LAB — ETHANOL: Alcohol, Ethyl (B): <5 mg/dL (ref <5)

## 2016-03-19 LAB — APTT: APTT: 28 s (ref 24–36)

## 2016-03-19 MED ORDER — CARVEDILOL 12.5 MG PO TABS: 25.0000 mg | ORAL_TABLET | Freq: Every day | ORAL | 0 refills | Status: DC

## 2016-03-19 MED ORDER — CEPHALEXIN 500 MG PO CAPS: 500.0000 mg | ORAL_CAPSULE | Freq: Four times a day (QID) | ORAL | 0 refills | Status: DC

## 2016-03-19 MED ORDER — LABETALOL HCL 5 MG/ML IV SOLN
10.0000 mg | Freq: Once | INTRAVENOUS | Status: AC
  Administered 2016-03-19: 10 mg via INTRAVENOUS
  Filled 2016-03-19: qty 4

## 2016-03-19 NOTE — ED Notes (Signed)
PA Jacoya Bauman at the bedside   

## 2016-03-19 NOTE — ED Notes (Signed)
Pt returned from CT °

## 2016-03-19 NOTE — ED Triage Notes (Signed)
Pt reports tingling, numbness and weakness to the right side of her body that started yesterday morning around 11am. She also states her BP has been elevated, at triage it is 199/121. Pt speaking clear complete sentences, no facial droop, no arm or leg drift at triage. Hx of stroke in 2006 and has had right sided weakness since but reports this numbness and tingling feels similar to when she had her stroke in 2006.

## 2016-03-19 NOTE — ED Notes (Addendum)
Patient transported to MRI. Patient ring given to family.

## 2016-03-19 NOTE — ED Notes (Signed)
MD Schlossman at the bedside  

## 2016-03-19 NOTE — ED Provider Notes (Signed)
[facility] DEPT Provider Note   CSN: [id number] Arrival date & time: 03/19/16  1100     History   Chief Complaint Chief Complaint  Patient presents with  . Numbness    HPI Alexandria Hayes is a 71 y.o. female with a hx of Migraine headache, thyroid disease, CVA in 2006 presents to the Emergency Department complaining of Acute onset right-sided numbness of the face, right arm and right leg onset 10 AM yesterday morning. Patient reports symptoms were the same as her previous stroke in 2006. She reports some right-sided weakness after her stroke which improved with physical therapy but yesterday symptoms caused increased weakness.  She reports attempting to treat her blood pressure and symptoms throughout the day with some improvement but without complete resolution. She awoke with persistent symptoms this morning causing her to present to the emergency department. Patient reports she has not seen a neurologist in approximately 2 years. No activating or alleviating factors today. She denies recent illness.  Pt timeline:  10am - ssx start with right sided numbness and weakness with headache  BP 175/111 11am - BP 164/107 - Pt took Amolodipine  1pm  - BP 189/115 - Pt took 1/2 tab Avapro 2pm - BP 197/119 - Pt took Xanax and Coreg 5pm - BP 187/116 6pm - Pt developed ssx of heart racing - took 1 atenolol 8:30pm - BP 138/89 (pt reports normal BP) - at this time leg weakness and numbness remained with some improvement in arm numbness and weakness 9am - Pt with persistent leg symptoms upon waking this morning.         The history is provided by the patient, medical records and the spouse. No language interpreter was used.    Past Medical History:  Diagnosis Date  . Migraines   . Stroke ([place])   . Thyroid disease     There are no active problems to display for this patient.   Past Surgical History:  Procedure Laterality Date  . ABDOMINAL HYSTERECTOMY    . BACK SURGERY    .  CATARACT EXTRACTION, BILATERAL    . REPLACEMENT TOTAL KNEE BILATERAL      OB History    No data available       Home Medications    Prior to Admission medications   Medication Sig Start Date End Date Taking? Authorizing Provider  ALPRAZolam Duanne Moron) 0.5 MG tablet Take 0.5 mg by mouth daily as needed for anxiety. 02/21/16  Yes Historical Provider, MD  Ascorbic Acid (VITAMIN C PO) Take 1 tablet by mouth daily.   Yes Historical Provider, MD  calcium carbonate (TUMS - DOSED IN MG ELEMENTAL CALCIUM) 500 MG chewable tablet Chew 1 tablet by mouth daily.   Yes Historical Provider, MD  Cholecalciferol (D-3-5) 5000 units capsule Take 5,000 Units by mouth daily.   Yes Historical Provider, MD  co-enzyme Q-10 50 MG capsule Take 100 mg by mouth daily.   Yes Historical Provider, MD  gabapentin (NEURONTIN) 300 MG capsule Take 300 mg by mouth at bedtime. 11/25/15  Yes Historical Provider, MD  hydrochlorothiazide (MICROZIDE) 12.5 MG capsule Take 12.5 mg by mouth daily.   Yes Historical Provider, MD  irbesartan (AVAPRO) 300 MG tablet Take 300 mg by mouth at bedtime.   Yes Historical Provider, MD  Levothyroxine Sodium 88 MCG CAPS Take 88 mcg by mouth every morning. 12/02/15  Yes Historical Provider, MD  MAGNESIUM PO Take 150 mg by mouth daily. Powder Form   Yes Historical Provider, MD  Multiple Vitamin (MULTIVITAMIN WITH MINERALS) TABS tablet Take 1 tablet by mouth daily.   Yes Historical Provider, MD  naproxen sodium (ANAPROX) 220 MG tablet Take 440 mg by mouth daily as needed (pain).   Yes Historical Provider, MD  Potassium 99 MG TABS Take 1 tablet by mouth daily.   Yes Historical Provider, MD  Probiotic Product (PROBIOTIC PO) Take 1 tablet by mouth daily.   Yes Historical Provider, MD  carvedilol (COREG) 12.5 MG tablet Take 2 tablets (25 mg total) by mouth at bedtime. 03/19/16   Eswin Worrell, PA-C  cephALEXin (KEFLEX) 500 MG capsule Take 1 capsule (500 mg total) by mouth 4 (four) times daily. 03/19/16    Jarrett Soho Toneka Fullen, PA-C    Family History No family history on file.  Social History Social History  Substance Use Topics  . Smoking status: Never Smoker  . Smokeless tobacco: Never Used  . Alcohol use No     Allergies   Amlodipine; Clonidine; Nitrofuran derivatives; Triamcinolone acetonide; Ciprofloxacin; Cortisone; Erythromycin; Levaquin [levofloxacin in d5w]; Penicillins; Prochlorperazine edisylate; and Tetracyclines & related   Review of Systems Review of Systems  Neurological: Positive for numbness.  All other systems reviewed and are negative.    Physical Exam Updated Vital Signs BP (!) 194/97   Pulse 64   Temp 98.6 F (37 C)   Resp 14   SpO2 100%   Physical Exam  Constitutional: She is oriented to person, place, and time. She appears well-developed and well-nourished. No distress.  HENT:  Head: Normocephalic and atraumatic.  Mouth/Throat: Oropharynx is clear and moist.  Eyes: Conjunctivae and EOM are normal. Pupils are equal, round, and reactive to light. No scleral icterus.  No horizontal, vertical or rotational nystagmus  Neck: Normal range of motion. Neck supple.  Full active and passive ROM without pain No midline or paraspinal tenderness No nuchal rigidity or meningeal signs  Cardiovascular: Normal rate, regular rhythm and intact distal pulses.   Pulmonary/Chest: Effort normal and breath sounds normal. No respiratory distress. She has no wheezes. She has no rales.  Abdominal: Soft. Bowel sounds are normal. There is no tenderness. There is no rebound and no guarding.  Musculoskeletal: Normal range of motion.  Lymphadenopathy:    She has no cervical adenopathy.  Neurological: She is alert and oriented to person, place, and time. No cranial nerve deficit. She exhibits normal muscle tone. Coordination normal.  Mental Status:  Alert, oriented, thought content appropriate. Speech fluent without evidence of aphasia. Able to follow 2 step commands without  difficulty.  Cranial Nerves:  II:  Peripheral visual fields grossly normal, pupils equal, round, reactive to light III,IV, VI: ptosis not present, extra-ocular motions intact bilaterally  V,VII: Minimal facial droop at the right mouth at rest, preservation of forehead tone; smile symmetric, facial light touch sensation equal VIII: hearing grossly normal bilaterally  IX,X: midline uvula rise  XI: bilateral shoulder shrug equal and strong XII: midline tongue extension  Motor:  5/5 in left upper and lower extremities including grip strength and dorsiflexion/plantar flexion  4+/5 in right upper and lower extremities including grip strength and dorsiflexion/plantar  Sensory: light touch normal in bilateral upper extremities, LLE, but subjectively decreased in the RLE.  Cerebellar: normal finger-to-nose with bilateral upper extremities Gait: Pt off balance with right foot drop CV: distal pulses palpable throughout   Skin: Skin is warm and dry. No rash noted. She is not diaphoretic.  Psychiatric: She has a normal mood and affect. Her behavior is normal. Judgment and  thought content normal.  Nursing note and vitals reviewed.    ED Treatments / Results  Labs (all labs ordered are listed, but only abnormal results are displayed) Labs Reviewed  COMPREHENSIVE METABOLIC PANEL - Abnormal; Notable for the following:       Result Value   Sodium 131 (*)    Chloride 94 (*)    Glucose, Bld 111 (*)    All other components within normal limits  RAPID URINE DRUG SCREEN, HOSP PERFORMED - Abnormal; Notable for the following:    Benzodiazepines POSITIVE (*)    All other components within normal limits  URINALYSIS, ROUTINE W REFLEX MICROSCOPIC - Abnormal; Notable for the following:    Leukocytes, UA MODERATE (*)    Bacteria, UA FEW (*)    All other components within normal limits  CBG MONITORING, ED - Abnormal; Notable for the following:    Glucose-Capillary 112 (*)    All other components within  normal limits  I-STAT CHEM 8, ED - Abnormal; Notable for the following:    Sodium 132 (*)    Chloride 93 (*)    Glucose, Bld 108 (*)    Hemoglobin 15.3 (*)    All other components within normal limits  URINE CULTURE  PROTIME-INR  APTT  CBC  DIFFERENTIAL  ETHANOL  I-STAT TROPOININ, ED    EKG  EKG Interpretation  Date/Time:  Saturday March 19 2016 11:08:25 EDT Ventricular Rate:  69 PR Interval:  166 QRS Duration: 88 QT Interval:  376 QTC Calculation: 402 R Axis:   -38 Text Interpretation:  Normal sinus rhythm Left axis deviation Inferior infarct , age undetermined Anterior infarct , age undetermined Abnormal ECG No previous ECGs available Confirmed by [facility] MD, ERIN ([id number]) on 03/19/2016 1:03:52 PM        Radiology Ct Head Wo Contrast  Result Date: 03/19/2016 CLINICAL DATA:  Right-sided numbness since yesterday. Hypertension. Previous stroke. EXAM: CT HEAD WITHOUT CONTRAST TECHNIQUE: Contiguous axial images were obtained from the base of the skull through the vertex without intravenous contrast. COMPARISON:  None. FINDINGS: Brain: Diffusely enlarged ventricles and subarachnoid spaces. Patchy white matter low density in both cerebral hemispheres. No intracranial hemorrhage, mass lesion or CT evidence of acute infarction. Vascular: No hyperdense vessel or unexpected calcification. Skull: Normal. Negative for fracture or focal lesion. Sinuses/Orbits: Retained secretions in the sphenoid sinus. Unremarkable orbits. Other: None. IMPRESSION: No acute abnormality. Mild atrophy and mild chronic small vessel white matter ischemic changes in both cerebral hemispheres. Electronically Signed   By: Claudie Revering M.D.   On: 03/19/2016 12:45   Mr Brain Wo Contrast  Result Date: 03/19/2016 CLINICAL DATA:  Numbness, tingling, and weakness involving the right side of the body. Foot drop. Elevated blood pressure. Prior stroke. EXAM: MRI HEAD WITHOUT CONTRAST TECHNIQUE: Multiplanar, multiecho  pulse sequences of the brain and surrounding structures were obtained without intravenous contrast. COMPARISON:  Head CT 03/19/2016 FINDINGS: Brain: There is no evidence of acute infarct, intracranial hemorrhage, mass, midline shift, or extra-axial fluid collection. There is mild generalized cerebral atrophy. Patchy T2 hyperintensities in the subcortical and deep cerebral white matter are nonspecific but compatible with moderate chronic small vessel ischemic disease. There is a chronic lacunar infarct in the right thalamus. Vascular: Major intracranial vascular flow voids are preserved. Skull and upper cervical spine: Unremarkable bone marrow signal. Sinuses/Orbits: Bilateral cataract extraction. Small volume left sphenoid sinus secretions. Clear mastoid air cells. Other: None. IMPRESSION: 1. No acute intracranial abnormality. 2. Moderate chronic small vessel ischemic disease.  Electronically Signed   By: Logan Bores M.D.   On: 03/19/2016 15:20    Procedures Procedures (including critical care time)  Medications Ordered in ED Medications  labetalol (NORMODYNE,TRANDATE) injection 10 mg (10 mg Intravenous Given 03/19/16 1320)     Initial Impression / Assessment and Plan / ED Course  I have reviewed the triage vital signs and the nursing notes.  Pertinent labs & imaging results that were available during my care of the patient were reviewed by me and considered in my medical decision making (see chart for details).     Patient presents with persistent numbness and weakness of her right arm and leg for greater than 24 hours. Patient with history of CVA with right-sided deficits and has previously had numbness here. Last 24-hour she has been self-medicating to bring down her blood pressure.    On exam here patient with right foot drop and some subjective numbness to the right lower extremity. Minimal weakness and I feel this is likely residual from previous stroke. No slurred speech. CT scan and MRI  are without acute abnormality and symptoms are persistent. Doubt TIA/CVA at this time. Patient does have UTI on UA and is now endorsing urinary symptoms.  She reports this often makes her blood pressure go up. Patient's blood pressure has decreased some here in the emergency department. She is ambulatory without assistance. We'll treat her UTI and increase her Coreg. Patient has been referred to primary care cardiology and neurology for outpatient follow-up of all her diagnoses. Discussed reasons to return to emergency department if patient and husband. They state understanding and in agreement with the plan.  The patient was discussed with and seen by Dr. Billy Fischer who agrees with the treatment plan.  BP (!) 180/99 (BP Location: Right Arm)   Pulse 63   Temp 98 F (36.7 C) (Oral)   Resp 16   SpO2 98%    Final Clinical Impressions(s) / ED Diagnoses   Final diagnoses:  Numbness  Essential hypertension  Acute cystitis without hematuria    New Prescriptions New Prescriptions   CEPHALEXIN (KEFLEX) 500 MG CAPSULE    Take 1 capsule (500 mg total) by mouth 4 (four) times daily.     Jarrett Soho Bernhardt Riemenschneider, PA-C 03/19/16 [place], MD 03/20/16 [id number]

## 2016-03-19 NOTE — ED Notes (Signed)
CBG 112.  

## 2016-03-19 NOTE — ED Notes (Signed)
Pt returned from MRI °

## 2016-03-19 NOTE — ED Notes (Signed)
PT taken to CT.

## 2016-03-19 NOTE — Discharge Instructions (Signed)
1. Medications: Until you taking her home medications as directed. Take Coreg 12.5 mg every morning. Increase bedtime dosage of Coreg to 25 mg,  2. Treatment: rest, drink plenty of fluids,  3. Follow Up: Please set up a new patient appointment with the recommended primary care physician for discussion of your diagnoses and further evaluation after today's visit; if you do not have a primary care doctor use the resource guide provided to find one; Please return to the ER for slurred speech, increased difficult to walking, increasing symptoms or other concerns.

## 2016-03-19 NOTE — ED Notes (Signed)
Pt taken to MRI  

## 2016-03-21 ENCOUNTER — Telehealth: Payer: Self-pay | Admitting: *Deleted

## 2016-03-21 DIAGNOSIS — R300 Dysuria: Secondary | ICD-10-CM | POA: Diagnosis not present

## 2016-03-21 DIAGNOSIS — N3000 Acute cystitis without hematuria: Secondary | ICD-10-CM | POA: Diagnosis not present

## 2016-03-21 LAB — URINE CULTURE

## 2016-03-21 NOTE — Telephone Encounter (Signed)
Pt called for results of culture.  EDCM advised that CM is not correct avenue and that she will be receiving a call from correct department today, as the labs have not been reviewed with PA as of yet.

## 2016-03-22 ENCOUNTER — Telehealth: Payer: Self-pay | Admitting: Emergency Medicine

## 2016-03-22 NOTE — Telephone Encounter (Signed)
Post ED Visit - Positive Culture Follow-up  Culture report reviewed by antimicrobial stewardship pharmacist:  []  Elenor Quinones, Pharm.D. []  Heide Guile, Pharm.D., BCPS AQ-ID []  Parks Neptune, Pharm.D., BCPS []  Alycia Rossetti, Pharm.D., BCPS []  [place], Pharm.D., BCPS, AAHIVP []  Legrand Como, Pharm.D., BCPS, AAHIVP []  Salome Arnt, PharmD, BCPS []  Dimitri Ped, PharmD, BCPS []  Vincenza Hews, PharmD, BCPS  Positive urine culture Treated with cephalexin, organism sensitive to the same and no further patient follow-up is required at this time.  Hazle Nordmann 03/22/2016, 11:56 AM

## 2016-08-31 DIAGNOSIS — H40003 Preglaucoma, unspecified, bilateral: Secondary | ICD-10-CM | POA: Diagnosis not present

## 2016-08-31 DIAGNOSIS — H524 Presbyopia: Secondary | ICD-10-CM | POA: Diagnosis not present

## 2016-09-07 DIAGNOSIS — I10 Essential (primary) hypertension: Secondary | ICD-10-CM | POA: Diagnosis not present

## 2016-09-07 DIAGNOSIS — F419 Anxiety disorder, unspecified: Secondary | ICD-10-CM | POA: Diagnosis not present

## 2016-09-07 DIAGNOSIS — E7800 Pure hypercholesterolemia, unspecified: Secondary | ICD-10-CM | POA: Diagnosis not present

## 2016-09-07 DIAGNOSIS — R7301 Impaired fasting glucose: Secondary | ICD-10-CM | POA: Diagnosis not present

## 2016-09-07 DIAGNOSIS — Z131 Encounter for screening for diabetes mellitus: Secondary | ICD-10-CM | POA: Diagnosis not present

## 2016-09-07 DIAGNOSIS — E039 Hypothyroidism, unspecified: Secondary | ICD-10-CM | POA: Diagnosis not present

## 2016-09-23 ENCOUNTER — Emergency Department (HOSPITAL_COMMUNITY)
Admission: EM | Admit: 2016-09-23 | Discharge: 2016-09-23 | Disposition: A | Discharge status: Home / Self Care | Payer: PPO | Attending: Emergency Medicine | Admitting: Emergency Medicine

## 2016-09-23 ENCOUNTER — Encounter (HOSPITAL_COMMUNITY): Payer: Self-pay

## 2016-09-23 ENCOUNTER — Emergency Department (HOSPITAL_COMMUNITY): Payer: PPO

## 2016-09-23 DIAGNOSIS — I4519 Other right bundle-branch block: Secondary | ICD-10-CM | POA: Diagnosis not present

## 2016-09-23 DIAGNOSIS — R001 Bradycardia, unspecified: Secondary | ICD-10-CM | POA: Insufficient documentation

## 2016-09-23 DIAGNOSIS — Z8673 Personal history of transient ischemic attack (TIA), and cerebral infarction without residual deficits: Secondary | ICD-10-CM | POA: Diagnosis not present

## 2016-09-23 DIAGNOSIS — Z79899 Other long term (current) drug therapy: Secondary | ICD-10-CM | POA: Insufficient documentation

## 2016-09-23 DIAGNOSIS — R2243 Localized swelling, mass and lump, lower limb, bilateral: Secondary | ICD-10-CM | POA: Insufficient documentation

## 2016-09-23 DIAGNOSIS — I10 Essential (primary) hypertension: Secondary | ICD-10-CM | POA: Insufficient documentation

## 2016-09-23 DIAGNOSIS — R200 Anesthesia of skin: Secondary | ICD-10-CM | POA: Diagnosis not present

## 2016-09-23 LAB — I-STAT CHEM 8, ED
BUN: 12 mg/dL (ref 6–20)
CALCIUM ION: 1.07 mmol/L — AB (ref 1.15–1.40)
Chloride: 90 mmol/L — ABNORMAL LOW (ref 101–111)
Creatinine, Ser: 0.7 mg/dL (ref 0.44–1.00)
Glucose, Bld: 136 mg/dL — ABNORMAL HIGH (ref 65–99)
HCT: 44 % (ref 36.0–46.0)
Hemoglobin: 15 g/dL (ref 12.0–15.0)
Potassium: 3.6 mmol/L (ref 3.5–5.1)
SODIUM: 124 mmol/L — AB (ref 135–145)
TCO2: 24 mmol/L (ref 22–32)

## 2016-09-23 LAB — COMPREHENSIVE METABOLIC PANEL
ALK PHOS: 85 U/L (ref 38–126)
ALT: 16 U/L (ref 14–54)
ANION GAP: 9 (ref 5–15)
AST: 24 U/L (ref 15–41)
Albumin: 4.3 g/dL (ref 3.5–5.0)
BILIRUBIN TOTAL: 0.9 mg/dL (ref 0.3–1.2)
BUN: 10 mg/dL (ref 6–20)
CALCIUM: 9.5 mg/dL (ref 8.9–10.3)
CO2: 23 mmol/L (ref 22–32)
Chloride: 92 mmol/L — ABNORMAL LOW (ref 101–111)
Creatinine, Ser: 0.75 mg/dL (ref 0.44–1.00)
GFR calc non Af Amer: >60 mL/min (ref >60)
Glucose, Bld: 137 mg/dL — ABNORMAL HIGH (ref 65–99)
POTASSIUM: 3.5 mmol/L (ref 3.5–5.1)
SODIUM: 124 mmol/L — AB (ref 135–145)
TOTAL PROTEIN: 6.8 g/dL (ref 6.5–8.1)

## 2016-09-23 LAB — PROTIME-INR
INR: 0.96
PROTHROMBIN TIME: 12.7 s (ref 11.4–15.2)

## 2016-09-23 LAB — CBC
HCT: 41.1 % (ref 36.0–46.0)
Hemoglobin: 14.5 g/dL (ref 12.0–15.0)
MCH: 30.5 pg (ref 26.0–34.0)
MCHC: 35.3 g/dL (ref 30.0–36.0)
MCV: 86.3 fL (ref 78.0–100.0)
PLATELETS: 221 10*3/uL (ref 150–400)
RBC: 4.76 MIL/uL (ref 3.87–5.11)
RDW: 13.2 % (ref 11.5–15.5)
WBC: 7.5 10*3/uL (ref 4.0–10.5)

## 2016-09-23 LAB — DIFFERENTIAL
Basophils Absolute: 0 10*3/uL (ref 0.0–0.1)
Basophils Relative: 0 %
EOS ABS: 0.1 10*3/uL (ref 0.0–0.7)
EOS PCT: 1 %
LYMPHS PCT: 16 %
Lymphs Abs: 1.2 10*3/uL (ref 0.7–4.0)
MONO ABS: 0.3 10*3/uL (ref 0.1–1.0)
Monocytes Relative: 4 %
Neutro Abs: 6 10*3/uL (ref 1.7–7.7)
Neutrophils Relative %: 79 %

## 2016-09-23 LAB — I-STAT TROPONIN, ED: TROPONIN I, POC: 0 ng/mL (ref 0.00–0.08)

## 2016-09-23 LAB — APTT: aPTT: 31 seconds (ref 24–36)

## 2016-09-23 NOTE — ED Provider Notes (Signed)
[facility] DEPT Provider Note   CSN: [id number] Arrival date & time: 09/23/16  1287     History   Chief Complaint Chief Complaint  Patient presents with  . Numbness  . Cough    HPI Alexandria Hayes is a 71 y.o. female.  Patient is here for evaluation of high blood pressure, recently started on Cardizem 120 mg daily, by her PCP about 2 weeks ago.  She stopped taking it after the first dose because she felt like it caused palpitations.  She awoke this morning feeling uncomfortable checked her blood pressure and it was elevated.  Also about that time she noticed some numbness and tingling around her lips, her left face and her left hand.  This numbness has gradually improved, and almost entirely resolved.  She denies headache, neck pain, back pain, weakness or dizziness.  She continues to take the remainder of her medications, watches her diet, and tries to drink a lot of fluids.  She follows with her PCP regularly who has been adjusting her medications.  She reports a prior stroke that was treated with blood pressure medication adjustment.  She has no long-term disability from the stroke.  She denies fever, chills, nausea or vomiting.  There are no other known modifying factors.  HPI  Past Medical History:  Diagnosis Date  . Migraines   . Stroke ([place])   . Thyroid disease     There are no active problems to display for this patient.   Past Surgical History:  Procedure Laterality Date  . ABDOMINAL HYSTERECTOMY    . BACK SURGERY    . CATARACT EXTRACTION, BILATERAL    . REPLACEMENT TOTAL KNEE BILATERAL      OB History    No data available       Home Medications    Prior to Admission medications   Medication Sig Start Date End Date Taking? Authorizing Provider  ALPRAZolam Duanne Moron) 0.5 MG tablet Take 0.5 mg by mouth daily as needed for anxiety. 02/21/16   [provider]  Ascorbic Acid (VITAMIN C PO) Take 1 tablet by mouth daily.    [provider]    calcium carbonate (TUMS - DOSED IN MG ELEMENTAL CALCIUM) 500 MG chewable tablet Chew 1 tablet by mouth daily.    [provider]  carvedilol (COREG) 12.5 MG tablet Take 2 tablets (25 mg total) by mouth at bedtime. 03/19/16   Muthersbaugh, Jarrett Soho, PA-C  cephALEXin (KEFLEX) 500 MG capsule Take 1 capsule (500 mg total) by mouth 4 (four) times daily. 03/19/16   Muthersbaugh, Jarrett Soho, PA-C  Cholecalciferol (D-3-5) 5000 units capsule Take 5,000 Units by mouth daily.    [provider]  co-enzyme Q-10 50 MG capsule Take 100 mg by mouth daily.    [provider]  gabapentin (NEURONTIN) 300 MG capsule Take 300 mg by mouth at bedtime. 11/25/15   [provider]  hydrochlorothiazide (MICROZIDE) 12.5 MG capsule Take 12.5 mg by mouth daily.    [provider]  irbesartan (AVAPRO) 300 MG tablet Take 300 mg by mouth at bedtime.    [provider]  Levothyroxine Sodium 88 MCG CAPS Take 88 mcg by mouth every morning. 12/02/15   [provider]  MAGNESIUM PO Take 150 mg by mouth daily. Powder Form    [provider]  Multiple Vitamin (MULTIVITAMIN WITH MINERALS) TABS tablet Take 1 tablet by mouth daily.    [provider]  naproxen sodium (ANAPROX) 220 MG tablet Take 440 mg by  mouth daily as needed (pain).    [provider]  Potassium 99 MG TABS Take 1 tablet by mouth daily.    [provider]  Probiotic Product (PROBIOTIC PO) Take 1 tablet by mouth daily.    [provider]    Family History No family history on file.  Social History Social History  Substance Use Topics  . Smoking status: Never Smoker  . Smokeless tobacco: Never Used  . Alcohol use No     Allergies   Amlodipine; Clonidine; Nitrofuran derivatives; Triamcinolone acetonide; Ciprofloxacin; Cortisone; Erythromycin; Levaquin [levofloxacin in d5w]; Penicillins; Prochlorperazine edisylate; and Tetracyclines & related   Review of  Systems Review of Systems  All other systems reviewed and are negative.    Physical Exam Updated Vital Signs BP (!) 165/98 (BP Location: Right Arm)   Pulse 63   Temp (!) 97.3 F (36.3 C)   Resp 12   Wt 99.8 kg (220 lb)   SpO2 99%   Physical Exam  Constitutional: She is oriented to person, place, and time. She appears well-developed and well-nourished. No distress.  HENT:  Head: Normocephalic and atraumatic.  Eyes: Pupils are equal, round, and reactive to light. Conjunctivae and EOM are normal.  Neck: Normal range of motion and phonation normal. Neck supple.  Cardiovascular: Normal rate and regular rhythm.   Pulmonary/Chest: Effort normal and breath sounds normal. She exhibits no tenderness.  Abdominal: Soft. She exhibits no distension. There is no tenderness. There is no guarding.  Musculoskeletal: Normal range of motion. She exhibits edema (2+ bilateral lower legs).  Neurological: She is alert and oriented to person, place, and time. No cranial nerve deficit. She exhibits normal muscle tone. Coordination normal.  No dysarthria, aphasia or nystagmus.  Normal light touch sensation both hands, and both legs.  Skin: Skin is warm and dry.  Psychiatric: She has a normal mood and affect. Her behavior is normal. Judgment and thought content normal.  Nursing note and vitals reviewed.    ED Treatments / Results  Labs (all labs ordered are listed, but only abnormal results are displayed) Labs Reviewed  COMPREHENSIVE METABOLIC PANEL - Abnormal; Notable for the following:       Result Value   Sodium 124 (*)    Chloride 92 (*)    Glucose, Bld 137 (*)    All other components within normal limits  I-STAT CHEM 8, ED - Abnormal; Notable for the following:    Sodium 124 (*)    Chloride 90 (*)    Glucose, Bld 136 (*)    Calcium, Ion 1.07 (*)    All other components within normal limits  PROTIME-INR  APTT  CBC  DIFFERENTIAL  I-STAT TROPONIN, ED  CBG MONITORING, ED    EKG  EKG  Interpretation  Date/Time:  Friday September 23 2016 07:41:47 EDT Ventricular Rate:  59 PR Interval:  172 QRS Duration: 102 QT Interval:  432 QTC Calculation: 427 R Axis:   -7 Text Interpretation:  Sinus bradycardia Incomplete right bundle branch block Cannot rule out Inferior infarct , age undetermined Anterior infarct , age undetermined Abnormal ECG since last tracing no significant change Confirmed by Daleen Bo [phone]) on 09/23/2016 10:51:27 AM       Radiology Ct Head Wo Contrast  Result Date: 09/23/2016 CLINICAL DATA:  Left facial and left arm numbness for 3 days EXAM: CT HEAD WITHOUT CONTRAST TECHNIQUE: Contiguous axial images were obtained from the base of the skull through the vertex without intravenous contrast. COMPARISON:  MRI 03/19/2016 FINDINGS: Brain: There is atrophy and chronic small vessel disease changes. No acute intracranial abnormality. Specifically, no hemorrhage, hydrocephalus, mass lesion, acute infarction, or significant intracranial injury. Vascular: No hyperdense vessel or unexpected calcification. Skull: No acute calvarial abnormality. Sinuses/Orbits: Visualized paranasal sinuses and mastoids clear. Orbital soft tissues unremarkable. Other: None IMPRESSION: No acute intracranial abnormality. Atrophy, chronic microvascular disease. Electronically Signed   By: Rolm Baptise M.D.   On: 09/23/2016 09:02    Procedures Procedures (including critical care time)  Medications Ordered in ED Medications - No data to display   Initial Impression / Assessment and Plan / ED Course  I have reviewed the triage vital signs and the nursing notes.  Pertinent labs & imaging results that were available during my care of the patient were reviewed by me and considered in my medical decision making (see chart for details).  Clinical Course as of Sep 24 1139  Fri Sep 23, 2016  1111 At 4 AM today BP 206/131. She took Atenolol 25 mg as single dose.  [EW]    Clinical Course User  Index [EW] Daleen Bo, MD     Patient Vitals for the past 24 hrs:  BP Temp Pulse Resp SpO2 Weight  09/23/16 1140 (!) 165/98 - 63 12 99 % -  09/23/16 1024 (!) 175/95 - 70 16 100 % -  09/23/16 0745 (!) 192/99 (!) 97.3 F (36.3 C) 65 18 100 % -  09/23/16 0739 - - - - - 99.8 kg (220 lb)    11:40 AM Reevaluation with update and discussion. After initial assessment and treatment, an updated evaluation reveals at this time the patient is calm comfortable.  Findings discussed with patient and husband, all questions answered. Tunya Held L      Final Clinical Impressions(s) / ED Diagnoses   Final diagnoses:  Hypertension, unspecified type    High blood pressure, with mild symptoms, which tend to improve as blood pressure improves.  Doubt TIA, CVA, hypertensive urgency, metabolic instability or impending vascular collapse.  Nursing Notes Reviewed/ Care Coordinated Applicable Imaging Reviewed Interpretation of Laboratory Data incorporated into ED treatment  The patient appears reasonably screened and/or stabilized for discharge and I doubt any other medical condition or other [facility] requiring further screening, evaluation, or treatment in the ED at this time prior to discharge.  Plan: Home Medications-continue current medications, consider restarting diltiazem; Home Treatments-rest, fluids, low-salt diet, light exercise; return here if the recommended treatment, does not improve the symptoms; Recommended follow up-PCP as needed and referral to cardiology for comprehensive evaluation of cardiovascular system and blood pressure.    New Prescriptions New Prescriptions   No medications on file     Daleen Bo, MD 09/23/16 1142

## 2016-09-23 NOTE — Discharge Instructions (Signed)
Call the cardiology office for a follow-up appointment to be seen about high blood pressure, and asked them about getting monitoring and blood pressure adjustment.  Consider restarting the diltiazem, and take it daily to improve your blood pressure.  There is no sign of atrial fibrillation, today.  Return here, if needed, for problems.

## 2016-09-23 NOTE — ED Triage Notes (Signed)
Pt presents to the ed with family with multiple complaints. Pt reports previous stroke and history of htn. Pt has been trying to get blood pressure under control and is being worked up for possible atrial fib.  The patient comes today with complaints of cough x 2 days. States she woke up yesterday morning with numbness in her left hand and then woke up this morning with numbness and heaviness in her left arm left lip and left side of her face, pt also endorses bilateral leg weakness for "months". LSN last night at 2200.

## 2016-09-23 NOTE — ED Notes (Signed)
ED Provider at bedside. 

## 2016-09-27 ENCOUNTER — Ambulatory Visit (INDEPENDENT_AMBULATORY_CARE_PROVIDER_SITE_OTHER): Payer: PPO | Admitting: Cardiology

## 2016-09-27 ENCOUNTER — Encounter: Payer: Self-pay | Admitting: Cardiology

## 2016-09-27 VITALS — BP 132/88 | HR 52 | Ht 67.0 in | Wt 219.0 lb

## 2016-09-27 DIAGNOSIS — R002 Palpitations: Secondary | ICD-10-CM | POA: Diagnosis not present

## 2016-09-27 DIAGNOSIS — M7989 Other specified soft tissue disorders: Secondary | ICD-10-CM | POA: Diagnosis not present

## 2016-09-27 DIAGNOSIS — I10 Essential (primary) hypertension: Secondary | ICD-10-CM | POA: Diagnosis not present

## 2016-09-27 DIAGNOSIS — E871 Hypo-osmolality and hyponatremia: Secondary | ICD-10-CM

## 2016-09-27 NOTE — Patient Instructions (Addendum)
MEDICATION CHANGES  STOP TAKING HCTZ-- HYDROCHLOROTHIAZIDE    SCHEDULE AT [place] [address] Your physician has recommended that you wear an event monitor FOR 30 DAYS. Event monitors are medical devices that record the heart's electrical activity. Doctors most often [place] these monitors to diagnose arrhythmias. Arrhythmias are problems with the speed or rhythm of the heartbeat. The monitor is a small, portable device. You can wear one while you do your normal daily activities. This is usually used to diagnose what is causing palpitations/syncope (passing out).    Your physician recommends that you schedule a follow-up appointment in 2 [place].

## 2016-09-27 NOTE — Progress Notes (Signed)
PCP: Algis Greenhouse, MD  Clinic Note: Chief Complaint  Patient presents with  . New Patient (Initial Visit)    palpitations, edema  . Hospitalization Follow-up    ER     HPI: Alexandria Hayes is a 71 y.o. female with a PMH below who presents today for emergency room follow-up -after presenting with hypertension as well as numbness and tingling of her lips and hands. Apparently she has a history of prior stroke with no residual findings.. She has chronic fatigue and fibromyalgia. She also indicates that she has had labile blood pressures rates. While living in [place], she was seeing a cardiologist for short period time. She had 2 stress tests that were negative for ischemia she also had an echo cardiac and that suggested mild left ventricular hypertrophy. She renal artery Dopplers for hypertension that were negative. Essentially that her valuation was negative at that time. She is currently undergoing neurology evaluation for possible multiple sclerosis.  Alexandria Hayes was last seen on September 5 by PCP and started on diltiazem--  but she stopped taking them because of "palpitations ".  Recent Hospitalizations: ER 9/21 -- she presented there with labile blood pressures and palpitations. Apparently she only took diltiazem for couple doses and noted that she felt her heart beating irregularly and fast and so she stopped it. In the emergency room she says she woke up feeling uncomfortable and check a blood pressure noted to be elevated and no's also noted her heart being elevated (never greater than 100 bpm) patient has some numbness and tingling of her lips and face that pretty much all symptoms had subsided by [facility] emergency room. She denied any weakness dizziness headache blurred vision etc. She indicates that she stays hydrated.  Studies Personally Reviewed - (if available, images/films reviewed: From Epic Chart or Care Everywhere)  Per patient report: 2 stress test negative for  ischemia, echocardiogram showed ingesting mild LVH and negative renal artery Dopplers (this is on antibiotics)  Interval History: Alexandria Hayes presents today for follow-up of her ER visit. She has multiple complaints most notably that she has had labile blood pressure going up and down associated with increased heart rates and palpitations. This can occur at random times off and on. When I asked further about the rapid heartbeats, she indicates that her resting heart rate is usually in the 50s and in these episodes her heart rates go up into the 80s. Her blood pressure may go to the 150s. With that she notes that the heart being faceted normal but not necessarily irregular. She denies any real dizziness associated with it or a chest discomfort when she just feels is tired. He notes that she gets tired easier than usual, but does have chronic fatigue. She denies any exertional dyspnea although when living in [place], she indicates that she used to be on home oxygen. She says that when her blood pressure goes up she tends to get a little more short of breath. She says that her legs and hands ache quite a bit. But her legs ache while walking and also after walking. Excellent but she says about his current issues of strange heartbeats as that "they are not like normal palpitations " -- sometimes it can happen with certain types of food or after certain activities. But she usually feels is night she'll wake up with her heart beating funny. Last week she's had 2 episodes one of them was when she went to the emergency room. The heart rate  going up last about one or 2 hours and just feels uneasy. Sometimes she and calm herself down but occasionally she'll take when necessary atenolol. She is quick to note that her heart rate does not go below 100 bpm on these occasions.   Importantly, she denies any resting or exertional chest pain or pressure. She denies any exertional dyspnea, PND or orthopnea. No symptoms of  syncope/near syncope or TIAs amaurosis fugax.  No melena, hematochezia, hematuria, or epstaxis. No claudication.  ROS: A comprehensive was performed. - Very positive review of systems, and when she doesn't answer positive, her husband will chime in with symptoms) Review of Systems  Constitutional: Positive for malaise/fatigue (tries to stay active, but afer exercise - gets tired. - chronic fatigue). Negative for weight loss.  HENT: Negative for congestion and nosebleeds.   Eyes: Negative.   Respiratory: Negative for shortness of breath.   Cardiovascular: Positive for palpitations and leg swelling (chronic). Negative for chest pain, orthopnea, claudication and PND.  Gastrointestinal: Negative for abdominal pain, blood in stool, heartburn and melena.  Genitourinary: Negative for hematuria.  Musculoskeletal: Positive for joint pain (s/p knee replacements; R shoulder) and myalgias.       Legs ache with walking. - has to get off her feet.  Skin: Negative.   Neurological: Positive for dizziness (h/o skull fxr ; occasionally positional) and headaches (h/o ? silent migraines). Negative for weakness.  Psychiatric/Behavioral: The patient is nervous/anxious.   All other systems reviewed and are negative.   I have reviewed and (if needed) personally updated the patient's problem list, medications, allergies, past medical and surgical history, social and family history.   Past Medical History:  Diagnosis Date  . Chronic fatigue disorder   . Essential hypertension   . History of closed head injury 05/2012   Skull fracture and left forehead; related to trauma  . Hypothyroid    On replacement therapy  . Migraines   . Stroke [facility])     Past Surgical History:  Procedure Laterality Date  . ABDOMINAL HYSTERECTOMY  12/2010  . BACK SURGERY  11/2007   L4-L5 fusion  . CATARACT EXTRACTION, BILATERAL  09/2009  . REPLACEMENT TOTAL KNEE BILATERAL Bilateral 2011   February and October    Current Meds   Medication Sig  . ALPRAZolam (XANAX) 0.5 MG tablet Take 0.5 mg by mouth daily as needed for anxiety.  . Ascorbic Acid (VITAMIN C PO) Take 1 tablet by mouth daily.  . calcium carbonate (TUMS - DOSED IN MG ELEMENTAL CALCIUM) 500 MG chewable tablet Chew 1 tablet by mouth daily as needed for indigestion or heartburn.   . carvedilol (COREG) 12.5 MG tablet Take 12.5 mg by mouth 2 (two) times daily with a meal.  . cephALEXin (KEFLEX) 500 MG capsule Take 1 capsule (500 mg total) by mouth 4 (four) times daily.  . Cholecalciferol (D-3-5) 5000 units capsule Take 5,000 Units by mouth daily.  Marland Kitchen co-enzyme Q-10 50 MG capsule Take 100 mg by mouth daily.  Marland Kitchen gabapentin (NEURONTIN) 300 MG capsule Take 300 mg by mouth at bedtime.  . irbesartan (AVAPRO) 300 MG tablet Take 300 mg by mouth at bedtime.  . Levothyroxine Sodium 88 MCG CAPS Take 88 mcg by mouth every morning.  Marland Kitchen MAGNESIUM PO Take 150 mg by mouth daily. Powder Form  . Multiple Vitamin (MULTIVITAMIN WITH MINERALS) TABS tablet Take 1 tablet by mouth daily.  . naproxen sodium (ANAPROX) 220 MG tablet Take 440 mg by mouth daily as needed (pain).  Marland Kitchen  Potassium 99 MG TABS Take 1 tablet by mouth daily.  . Probiotic Product (PROBIOTIC PO) Take 1 tablet by mouth daily.  . [DISCONTINUED] hydrochlorothiazide (MICROZIDE) 12.5 MG capsule Take 12.5 mg by mouth daily.    Allergies  Allergen Reactions  . Amlodipine Rash    Blisters on gums  . Clonidine Other (See Comments)    GI Upset (intolerance) Dry mouth Dizziness  . Nitrofuran Derivatives Anaphylaxis, Swelling and Rash       . Triamcinolone Acetonide   . Ciprofloxacin Nausea Only  . Cortisone Anxiety and Hypertension  . Erythromycin Rash  . Levaquin [Levofloxacin In D5w] Anxiety and Hypertension  . Penicillins Rash    Has patient had a PCN reaction causing immediate rash, facial/tongue/throat swelling, SOB or lightheadedness with hypotension: Yes Has patient had a PCN reaction causing severe rash  involving mucus membranes or skin necrosis: No Has patient had a PCN reaction that required hospitalization No Has patient had a PCN reaction occurring within the last 10 years: No If all of the above answers are "NO", then may proceed with Cephalosporin use.   Marland Kitchen Prochlorperazine Edisylate Anxiety and Hypertension    hypertension  . Tetracyclines & Related Rash    Social History   Social History  . Marital status: Married    Spouse name: N/A  . Number of children: 1  . Years of education: 71   Occupational History  . Medical transcriptionist    Social History Main Topics  . Smoking status: Never Smoker  . Smokeless tobacco: Never Used  . Alcohol use No  . Drug use: No  . Sexual activity: Not Asked   Other Topics Concern  . None   Social History Narrative   She is a married mother of one, grandmother of 3 (her current marriage has been for 13 years, so her child was somewhat different marriage).    She lives with her current husband.    She is currently retired but previously worked several different jobs including: Surveyor, mining, travel agent and PND. She did have some College.      She takes when necessary Xanax. Otherwise no drug use.   She does try to walk 20 minutes a day 3 days a week. But this is all from times weather related. She tries to do it but oftentimes will feel too tired. She is limited by leg aching and fatigue as well as FMS -- generalized hurting. & HA.     Family History   family history includes Breast cancer in her mother; Healthy in her father; Heart disease in her paternal grandfather; Lung cancer in her maternal grandfather; Other in her brother, brother, brother, father, and sister; Stroke in her paternal grandmother.  Wt Readings from Last 3 Encounters:  09/27/16 219 lb (99.3 kg)  09/23/16 220 lb (99.8 kg)    PHYSICAL EXAM BP 132/88 (BP Location: Left Arm)   Pulse (!) 52   Ht 5\' 7"  (1.702 m)   Wt 219 lb (99.3 kg)   BMI 34.30  kg/m  Physical Exam  Constitutional: She is oriented to person, place, and time. She appears well-developed and well-nourished. No distress.  Moderately obese. Well groomed  HENT:  Head: Normocephalic and atraumatic.  Mouth/Throat: No oropharyngeal exudate.  Eyes: Pupils are equal, round, and reactive to light. EOM are normal. No scleral icterus.  Neck: Normal range of motion. Neck supple. No hepatojugular reflux and no JVD present. Carotid bruit is not present. No tracheal deviation present. No thyromegaly  present.  Cardiovascular: Normal rate, regular rhythm, normal heart sounds and intact distal pulses.  PMI is displaced (Unable to palpate).  Exam reveals no gallop and no friction rub.   No murmur heard. Pulmonary/Chest: Effort normal and breath sounds normal. No respiratory distress. She has no wheezes. She has no rales. She exhibits tenderness.  Abdominal: Soft. Bowel sounds are normal. She exhibits distension. There is no tenderness. There is no rebound and no guarding.  Musculoskeletal: Normal range of motion. She exhibits no edema (Roughly 1-2+ bilaterally).  Lymphadenopathy:    She has no cervical adenopathy.  Neurological: She is alert and oriented to person, place, and time. No cranial nerve deficit.  Skin: Skin is warm and dry. No erythema.  Psychiatric: Judgment and thought content normal.  Somewhat anxious with the labile anxious and blunted affect. Generalized positive review of symptoms. Seemed to just be generally uncomfortable    Adult ECG Report  Rate: 52 ;  Rhythm: sinus bradycardia and Let this deviation (-35), including right bundle branch block. Cannot exclude inferior and anterior MI, age undetermined.;   Narrative Interpretation: No change from March 2018  Other studies Reviewed: Additional studies/ records that were reviewed today include:  Recent Labs:   Lab Results  Component Value Date   CREATININE 0.70 09/23/2016   BUN 12 09/23/2016   NA 124 (L)  09/23/2016   K 3.6 09/23/2016   CL 90 (L) 09/23/2016   CO2 23 09/23/2016   Lab Results  Component Value Date   WBC 7.5 09/23/2016   HGB 15.0 09/23/2016   HCT 44.0 09/23/2016   MCV 86.3 09/23/2016   PLT 221 09/23/2016   No results found for: TSH   ASSESSMENT / PLAN: Problem List Items Addressed This Visit    Essential hypertension (Chronic)    Her blood pressure was pretty good with carvedilol and irbesartan. She is on low-dose carvedilol. I'm stopping the HCTZ is a hyponatremia, if necessary we could potentially consider spironolactone since her potassium was 3.6.      Relevant Medications   carvedilol (COREG) 12.5 MG tablet   Other Relevant Orders   EKG 12-Lead (Completed)   Hyponatremia    Chronic issue with HCTZ -- d/c HCTZ. May need to use PRN Lasix or Spironolactone if edema worsens.      Leg swelling (Chronic)    Bilateral Ankle edema in the absence of PND or orthopnea is most consistent with venous stasis changes -- and not cardiac. Commended support stockings. Also recommended that we stop the HCTZ and had to potentially consider different diuretic in the future.      Rapid palpitations - Primary (Chronic)    She is calling her heart palpitations rapid, however they're never over 100 bpm. I suspect that she probably just has periods where her heart rate naturally goes up into the 80s and she notices it because it is a different spur. It doesn't seem to be irregular, however would like to exclude the possibility of slowly flutter or atrial fibrillation.   Because these episodes have not been very frequent,  we will have her wear a 30 day event monitor      Relevant Orders   EKG 12-Lead (Completed)   Cardiac event monitor      Current medicines are reviewed at length with the patient today. (+/- concerns) n/a The following changes have been made: d/c HCTZ - 2/2 HypoNa  Patient Instructions  MEDICATION CHANGES  STOP TAKING HCTZ--  HYDROCHLOROTHIAZIDE    SCHEDULE  AT [place] has recommended that you wear an event monitor FOR 30 DAYS. Event monitors are medical devices that record the heart's electrical activity. Doctors most often [place] these monitors to diagnose arrhythmias. Arrhythmias are problems with the speed or rhythm of the heartbeat. The monitor is a small, portable device. You can wear one while you do your normal daily activities. This is usually used to diagnose what is causing palpitations/syncope (passing out).    Your physician recommends that you schedule a follow-up appointment in 2 [place].    Studies Ordered:   Orders Placed This Encounter  Procedures  . Cardiac event monitor  . EKG 12-Lead      Glenetta Hew, M.D., M.S. Interventional Cardiologist   Pager # [phone] Phone # [phone] [address] [place], [place] [address]

## 2016-09-28 ENCOUNTER — Telehealth: Payer: Self-pay | Admitting: Cardiology

## 2016-09-28 NOTE — Telephone Encounter (Signed)
Patient advised that she will be given detailed instructions when the monitor is put on. Patient is allowed to take the monitor off for a brief period of time to shower. Patient advised there is a way to document symptoms and she will be shown that when the monitor is put on. Patient verbalized understanding.

## 2016-09-28 NOTE — Telephone Encounter (Signed)
°  New Message   pt verbalized that she is calling for rn   Pt has questions about monitor

## 2016-09-29 ENCOUNTER — Encounter: Payer: Self-pay | Admitting: Cardiology

## 2016-09-29 NOTE — Assessment & Plan Note (Signed)
She is calling her heart palpitations rapid, however they're never over 100 bpm. I suspect that she probably just has periods where her heart rate naturally goes up into the 80s and she notices it because it is a different spur. It doesn't seem to be irregular, however would like to exclude the possibility of slowly flutter or atrial fibrillation.   Because these episodes have not been very frequent,  we will have her wear a 30 day event monitor

## 2016-09-29 NOTE — Assessment & Plan Note (Signed)
Her blood pressure was pretty good with carvedilol and irbesartan. She is on low-dose carvedilol. I'm stopping the HCTZ is a hyponatremia, if necessary we could potentially consider spironolactone since her potassium was 3.6.

## 2016-09-29 NOTE — Assessment & Plan Note (Signed)
Chronic issue with HCTZ -- d/c HCTZ. May need to use PRN Lasix or Spironolactone if edema worsens.

## 2016-09-29 NOTE — Assessment & Plan Note (Addendum)
Bilateral Ankle edema in the absence of PND or orthopnea is most consistent with venous stasis changes -- and not cardiac. Commended support stockings. Also recommended that we stop the HCTZ and had to potentially consider different diuretic in the future.

## 2016-10-12 DIAGNOSIS — I10 Essential (primary) hypertension: Secondary | ICD-10-CM | POA: Diagnosis not present

## 2016-10-14 ENCOUNTER — Encounter: Payer: Self-pay | Admitting: Cardiology

## 2016-10-19 MED ORDER — SPIRONOLACTONE 25 MG PO TABS: 12.5000 mg | ORAL_TABLET | Freq: Every day | ORAL | 3 refills | Status: DC

## 2016-10-22 ENCOUNTER — Encounter: Payer: Self-pay | Admitting: Cardiology

## 2016-10-24 ENCOUNTER — Encounter: Payer: Self-pay | Admitting: Cardiology

## 2016-10-26 ENCOUNTER — Encounter: Payer: Self-pay | Admitting: Cardiology

## 2016-10-27 ENCOUNTER — Telehealth: Payer: Self-pay

## 2016-10-27 NOTE — Telephone Encounter (Signed)
Spoke with patient - she has been self medicating while on vacation for past couple of days.   She is eating all meals in restaurants and normally does not use salt at home.    Because of elevated BP yesterday she took an additional 150 mg irbesartan and 5 mg amlodipine.  She has an allergy to amlodipine (causes facial flushing and blisters on her gums), but she states she can take for 2-3 days before it gets bad.    Today with pressure still high she took 5 mg amlodipine again, at noon.  Concerned about what to do next.   Advised that she take another 2.5 mg amlodipine tonight but NO extra doses of irbesartan.  Tomorrow evening she can take another 5 mg amlodipine if needed.  She will be back from vacation tomorrow.    She is to continue spironolactone and other current medications.  She can call the office next week should her pressure not come down, but I suspect that she may be overly salt sensitive and hopefully things will improve over the weekend.  She states her cuff is an Omron, about 69 years old and has been verified with PCP in past year.

## 2016-10-27 NOTE — Telephone Encounter (Signed)
Per pt email: Dr Mariann Barter been hoping for a response about what to do if my BP goes up with taking SPIRO. I'm in [place] for a few days. I had an "episode" with heart felt like racing & 172/112 79 last night. Today A.M. was about 168/103 49. P.M.: 190/118 70 (sitting & standing). I "feel" ok but concerned. I noticed a BIG change in urination! I hardly have to go like I did! (Ex: I normally have to get up 2x at night but slept thru last night.) Could you please tell me if I need to take a med at this point to bring my BP down? Isn't it dangerous to ride out these numbers adj to a new med? Thankyou.  Received email above. Returned call to pt of dr Allison Quarry, she states that she is on vacation in [place] she states that the last few days her BP has been high as noted above. Yesterday after going sightseeing at 7pm 189/108 HR 65 she states that she takes all her medications as ordered, carvedilol 12.5mg  BID , irbesartan 300mg  @[facility] , and spironolactone 12.5mg  daily. She states that her BP this morning was high then she took it standing because it usually goes down when standing 161/112 HR 76 but bow sitting and relaxing 180/107 HR 63. She states that she sometimes has intermittent heart skipping but states  "just one every once in a awhile"  Tried to have her further elaborate but she is unable to. She has h/o anxiety and hyponatremia she states that this was why this has happen in the past but she states that she does not feel comfortable going to the ER while out of town, as she does not know the MD's. She states that Sunday she stopped HCTZ and started the spironolactone. She further states that she took an extra amlodipine(she states that she "keeps this on hand for when her BP is high but has not been directed to take it by Ellyn Hack) last night and her BP went down to 140/86 HR 63. She states that she is scheduled for a 30day heart monitor when she gets back from vacation (11-02-16). At the end of our conversation she states  "let me know if doctor wants to take the atenenol" she states that she has some of these from a long time ago that she keeps on-hand. She states that she is not getting "good medical care" so she has to keep theses medication on hand.

## 2016-10-27 NOTE — Telephone Encounter (Signed)
Returning your call. °

## 2016-10-31 ENCOUNTER — Telehealth: Payer: Self-pay | Admitting: Cardiology

## 2016-10-31 NOTE — Telephone Encounter (Signed)
SPOKE PATIENT.INFORMED TRY TAKING  COREG 12 .5MG  IN MORNING  AND TAKE 12.5 MG SPIRONLACTONE MID DAY. SHE STATES BLOOD PRESSURE INCREASED OVER PERIOD OF THE DAY.RANGE IS IN THE 130'S NOW.  REAASURED PATIENT. VERBALIZED UNDERSTANDING.

## 2016-10-31 NOTE — Telephone Encounter (Signed)
Please call,blood pressure have dropped-it was 107/83 standing and sitting down it was 116/85. She says it sounds good,but it is making her feel bad.She feels nauseated and lightheaded.

## 2016-11-01 NOTE — Telephone Encounter (Signed)
I think I did finally respond to the email.  Unfortunately, these emails are not high on the priority of things to read in the inbox.  Glenetta Hew, MD

## 2016-11-02 ENCOUNTER — Ambulatory Visit (INDEPENDENT_AMBULATORY_CARE_PROVIDER_SITE_OTHER): Payer: PPO

## 2016-11-02 DIAGNOSIS — R002 Palpitations: Secondary | ICD-10-CM

## 2016-11-07 DIAGNOSIS — H00014 Hordeolum externum left upper eyelid: Secondary | ICD-10-CM | POA: Diagnosis not present

## 2016-11-07 DIAGNOSIS — L739 Follicular disorder, unspecified: Secondary | ICD-10-CM | POA: Diagnosis not present

## 2016-11-08 DIAGNOSIS — E039 Hypothyroidism, unspecified: Secondary | ICD-10-CM | POA: Diagnosis not present

## 2016-11-08 DIAGNOSIS — E871 Hypo-osmolality and hyponatremia: Secondary | ICD-10-CM | POA: Diagnosis not present

## 2016-11-09 ENCOUNTER — Telehealth: Payer: Self-pay | Admitting: Cardiology

## 2016-11-09 NOTE — Telephone Encounter (Signed)
Returned the call to the patient. She stated that she recently went to her PCP and had her labs drawn. The patient has been having low sodium levels (previously 124) A call was placed to have the labs faxed over.  Her sodium level was now 130. The patient stated that she would like to be taken off of the spironolactone. The medication makes her nauseous and she's afraid it is lowering her sodium level.  Per Dr. Ellyn Hack, she can discontinue the spironolactone. She has been instructed to watch her blood pressure, keep a log and call us if her blood pressure runs high. She verbalized her understanding.

## 2016-11-09 NOTE — Telephone Encounter (Signed)
New message      Pt c/o medication issue:  1. Name of Medication:  spironolactone (ALDACTONE) 25 MG tablet Take 0.5 tablets (12.5 mg total) by mouth daily.     2. How are you currently taking this medication (dosage and times per day)?  1/2 tab daily  3. Are you having a reaction (difficulty breathing--STAT)? yes 4. What is your medication issue?  Sodium is still to low , needs to be taken off of this

## 2016-11-14 DIAGNOSIS — M1812 Unilateral primary osteoarthritis of first carpometacarpal joint, left hand: Secondary | ICD-10-CM | POA: Diagnosis not present

## 2016-11-15 DIAGNOSIS — M1812 Unilateral primary osteoarthritis of first carpometacarpal joint, left hand: Secondary | ICD-10-CM | POA: Diagnosis not present

## 2016-11-18 ENCOUNTER — Encounter: Payer: Self-pay | Admitting: Cardiology

## 2016-11-21 DIAGNOSIS — H0011 Chalazion right upper eyelid: Secondary | ICD-10-CM | POA: Diagnosis not present

## 2016-11-30 ENCOUNTER — Ambulatory Visit: Payer: PPO | Admitting: Cardiology

## 2016-12-06 DIAGNOSIS — I10 Essential (primary) hypertension: Secondary | ICD-10-CM | POA: Diagnosis not present

## 2016-12-14 ENCOUNTER — Ambulatory Visit: Payer: PPO | Admitting: Cardiology

## 2016-12-14 ENCOUNTER — Encounter: Payer: Self-pay | Admitting: Cardiology

## 2016-12-14 VITALS — BP 180/111 | HR 57 | Ht 66.0 in | Wt 218.0 lb

## 2016-12-14 DIAGNOSIS — R002 Palpitations: Secondary | ICD-10-CM | POA: Diagnosis not present

## 2016-12-14 DIAGNOSIS — I10 Essential (primary) hypertension: Secondary | ICD-10-CM

## 2016-12-14 DIAGNOSIS — E871 Hypo-osmolality and hyponatremia: Secondary | ICD-10-CM

## 2016-12-14 DIAGNOSIS — M7989 Other specified soft tissue disorders: Secondary | ICD-10-CM | POA: Diagnosis not present

## 2016-12-14 MED ORDER — AMLODIPINE BESYLATE 5 MG PO TABS: ORAL_TABLET | ORAL | 4 refills | Status: DC

## 2016-12-14 NOTE — Patient Instructions (Addendum)
Medications  --- as needed May take amlodipine 2.5 mg  1 to 2 tablets a week if blood pressure is greater than 170/90   no other changes.   Your physician wants you to follow-up in APRIL 2019  [place].You will receive a reminder letter in the mail two months in advance. If you don't receive a letter, please call our office to schedule the follow-up appointment.   If you need a refill on your cardiac medications before your next appointment, please call your pharmacy.

## 2016-12-14 NOTE — Progress Notes (Signed)
PCP: Algis Greenhouse, MD  Clinic Note: Chief Complaint  Patient presents with  . Follow-up    Monitor  . Palpitations  . Hypertension    Labile; HCTZ related hyponatremia    HPI:  Alexandria Hayes is a 71 y.o. female who is being seen today for follow-up evaluation of hypertension, palpitations, initially seen following ER visit at the request of Dough, Jaymes Graff, MD.  She had been seen in the emergency room on September 21 for labile blood pressures and palpitations. She has a history of prior stroke with no residual findings..  She has chronic fatigue and fibromyalgia. She also indicates that she has had labile blood pressures rates.  While living in [place], she was seeing a cardiologist for short period time. She had 2 stress tests that were negative for ischemia she also had an echo cardiac and that suggested mild left ventricular hypertrophy. She renal artery Dopplers for hypertension that were negative. Essentially that her valuation was negative at that time.  She is currently undergoing neurology evaluation for possible multiple sclerosis.  Alexandria Hayes was seen in initial consultation on 09/22/2016: She had multiple complaints mostly noted with increased heart rate/palpitations and labile blood pressures.  She says her usual resting heart rate is in the 50s, but occasionally will go up to the 80s.  I have ordered a 30-day event monitor.  We recommended stopping hydrochlorothiazide because of hyponatremia.  Recent Hospitalizations: None  Studies Personally Reviewed - (if available, images/films reviewed: From Epic Chart or Care Everywhere)  30-day Cardiac Event Monitor: Essentially normal.  Rare PVCs.  Heart rate ranged from 42-103 bpm; symptoms are noted with simply sinus rhythm as well as on one occasion with PVC.  Interval History: Alexandria Hayes returns today actually in great spirits.  She was very happy to hear the results of her monitor, and indicates that she really has not  felt that much in the way of any palpitations either.  She indicates that basically since we stopped the HCTZ and her sodium levels have corrected up to 135 with the most recent check, she has had better energy has felt more invigorated.  She has had less of the labile blood pressures.  She denies is the persistent dizziness.  She says her blood pressures been very well controlled at home and is a bit confused with a current blood pressure.  Her readings at home indicate that she is been having systolic blood pressures in the morning ranging from the 110s up to mid 140s.  Evening pressures have been a little higher ranging from the 130s-160s. She notes that she tried taking spironolactone, but had some issues with her sodium still not going down.  Therefore that was finally stopped.  She was also noting that since she has been off diuretics, she is been doing better than before. She started taking the Avapro in the evening and has stopped having as much of the dizziness during the day. She has cut back her coffee to 1/2-1 cup/day in the morning and notes the palpitations have improved.  Back in November she felt weak and sick and ended up having blepharitis and treated with Keflex.  Since that has been treated, her dizziness and palpitations have improved.  She does note when her days are more stressful than others, her palpitations seem to be more prominent as does her blood pressures tend to be higher, but otherwise she has been very happy.    She denies any resting or  exertional Chest tightness or pressure.  No significant exertional dyspnea, however she does indicate that she is quite out of shape.  She and her husband are trying to get back into doing some activity levels now that she is no longer dizzy.  she denies any dizziness or significant headaches, tachycardia issues and has had more energy ever since her sodium levels have gotten up.  She is very happy to finally stop diuretics.  She also notes no  syncope or near syncope, TIA/amaurosis fugax symptoms.  She has not had any significant edema despite not being on diuretic.  No claudication  ROS: A comprehensive was performed. Review of Systems  Constitutional: Negative for malaise/fatigue ( notably improved energy).  HENT: Negative for congestion and nosebleeds.   Respiratory: Negative for cough, shortness of breath and wheezing.   Cardiovascular: Negative for leg swelling.  Gastrointestinal: Negative for abdominal pain and blood in stool.  Genitourinary: Negative for dysuria, frequency and hematuria.  Musculoskeletal: Positive for joint pain (Stable -both knees and right shoulder). Negative for myalgias (Notably less since stopping diuretic).  Neurological: Positive for dizziness (Much improved). Negative for focal weakness and weakness.  Psychiatric/Behavioral: Negative for memory loss. The patient is nervous/anxious. The patient does not have insomnia.   All other systems reviewed and are negative.  I have reviewed and (if needed) personally updated the patient's problem list, medications, allergies, past medical and surgical history, social and family history.   Past Medical History:  Diagnosis Date  . Chronic fatigue disorder   . Essential hypertension   . History of closed head injury 05/2012   Skull fracture and left forehead; related to trauma  . Hypothyroid    On replacement therapy  . Migraines   . Stroke [facility])     Past Surgical History:  Procedure Laterality Date  . ABDOMINAL HYSTERECTOMY  12/2010  . BACK SURGERY  11/2007   L4-L5 fusion  . CATARACT EXTRACTION, BILATERAL  09/2009  . REPLACEMENT TOTAL KNEE BILATERAL Bilateral 2011   February and October    Current Meds  Medication Sig  . ALPRAZolam (XANAX) 0.5 MG tablet Take 0.5 mg by mouth daily as needed for anxiety.  . Ascorbic Acid (VITAMIN C PO) Take 1 tablet by mouth daily.  . calcium carbonate (TUMS - DOSED IN MG ELEMENTAL CALCIUM) 500 MG chewable  tablet Chew 1 tablet by mouth daily as needed for indigestion or heartburn.   . carvedilol (COREG) 12.5 MG tablet Take 12.5 mg by mouth 2 (two) times daily with a meal.  . Cholecalciferol (D-3-5) 5000 units capsule Take 5,000 Units by mouth daily.  Marland Kitchen co-enzyme Q-10 50 MG capsule Take 100 mg by mouth daily.  Marland Kitchen gabapentin (NEURONTIN) 300 MG capsule Take 300 mg by mouth at bedtime.  . irbesartan (AVAPRO) 300 MG tablet Take 300 mg by mouth at bedtime.  . Levothyroxine Sodium 88 MCG CAPS Take 88 mcg by mouth every morning.  Marland Kitchen MAGNESIUM PO Take 150 mg by mouth daily. Powder Form  . Multiple Vitamin (MULTIVITAMIN WITH MINERALS) TABS tablet Take 1 tablet by mouth daily.  . naproxen sodium (ANAPROX) 220 MG tablet Take 440 mg by mouth daily as needed (pain).  . Probiotic Product (PROBIOTIC PO) Take 1 tablet by mouth daily.    Allergies  Allergen Reactions  . Amlodipine Rash    Blisters on gums  . Clonidine Other (See Comments)    GI Upset (intolerance) Dry mouth Dizziness  . Nitrofuran Derivatives Anaphylaxis, Swelling and Rash       .  Triamcinolone Acetonide   . Ciprofloxacin Nausea Only  . Cortisone Anxiety and Hypertension  . Erythromycin Rash  . Levaquin [Levofloxacin In D5w] Anxiety and Hypertension  . Penicillins Rash    Has patient had a PCN reaction causing immediate rash, facial/tongue/throat swelling, SOB or lightheadedness with hypotension: Yes Has patient had a PCN reaction causing severe rash involving mucus membranes or skin necrosis: No Has patient had a PCN reaction that required hospitalization No Has patient had a PCN reaction occurring within the last 10 years: No If all of the above answers are "NO", then may proceed with Cephalosporin use.   Marland Kitchen Prochlorperazine Edisylate Anxiety and Hypertension    hypertension  . Tetracyclines & Related Rash    Social History   Socioeconomic History  . Marital status: Married    Spouse name: None  . Number of children: 1  .  Years of education: 48  . Highest education level: None  Social Needs  . Financial resource strain: None  . Food insecurity - worry: None  . Food insecurity - inability: None  . Transportation needs - medical: None  . Transportation needs - non-medical: None  Occupational History  . Occupation: Surveyor, mining  Tobacco Use  . Smoking status: Never Smoker  . Smokeless tobacco: Never Used  Substance and Sexual Activity  . Alcohol use: No  . Drug use: No  . Sexual activity: None  Other Topics Concern  . None  Social History Narrative   She is a married mother of one, grandmother of 3 (her current marriage has been for 13 years, so her child was somewhat different marriage).    She lives with her current husband.    She is currently retired but previously worked several different jobs including: Surveyor, mining, travel agent and PND. She did have some College.      She takes when necessary Xanax. Otherwise no drug use.   She does try to walk 20 minutes a day 3 days a week. But this is all from times weather related. She tries to do it but oftentimes will feel too tired. She is limited by leg aching and fatigue as well as FMS -- generalized hurting. & HA.     family history includes Breast cancer in her mother; Healthy in her father; Heart disease in her paternal grandfather; Lung cancer in her maternal grandfather; Other in her brother, brother, brother, father, and sister; Stroke in her paternal grandmother.  Wt Readings from Last 3 Encounters:  12/14/16 218 lb (98.9 kg)  09/27/16 219 lb (99.3 kg)  09/23/16 220 lb (99.8 kg)    PHYSICAL EXAM BP (!) 180/111   Pulse (!) 57   Ht 5\' 6"  (1.676 m)   Wt 218 lb (98.9 kg)   SpO2 96%   BMI 35.19 kg/m   -Recheck blood pressure was 160/90 mmHg Physical Exam  Constitutional: She is oriented to person, place, and time. She appears well-developed and well-nourished. No distress.  Healthy-appearing.  Well-groomed  well-groomed  HENT:  Head: Normocephalic and atraumatic.  Eyes: EOM are normal. No scleral icterus.  Neck: Neck supple. No hepatojugular reflux and no JVD present. Carotid bruit is not present.  Cardiovascular: Normal rate, regular rhythm, normal heart sounds, intact distal pulses and normal pulses.  No extrasystoles are present. PMI is not displaced (Difficult to palpate). Exam reveals no gallop and no friction rub.  No murmur heard. Pulmonary/Chest: Effort normal and breath sounds normal. No respiratory distress. She has no wheezes. She has  no rales.  Abdominal: Soft. Bowel sounds are normal. She exhibits no distension. There is no tenderness. There is no rebound.  Musculoskeletal: Normal range of motion. She exhibits no edema.  Neurological: She is alert and oriented to person, place, and time.  Skin: Skin is warm and dry.  Psychiatric: She has a normal mood and affect. Her behavior is normal. Judgment and thought content normal.  Much less anxious than before.  Still quite talkative  Nursing note and vitals reviewed.    Adult ECG Report Not checked  Other studies Reviewed: Additional studies/ records that were reviewed today include:  Recent Labs: November 30  Na+ 135, K+ 4.2, Cl- 98, HCO3- 32, BUN 15, Cr 0.8, Glu 103, Ca2+ 9.8;  ASSESSMENT / PLAN: Problem List Items Addressed This Visit    Essential hypertension - Primary (Chronic)    Blood pressure is high here today, but all of her home readings have been quite normal.  I am reluctant to act too much on this 1 blood pressure recording.  She has been pretty well controlled on the carvedilol and  irbesartan.  We will avoid diuretic for now. I agree that perhaps having a as needed medicine will be helpful.   Although not a typical as needed medicine, we will try as needed amlodipine 2.5 mg daily.  She has done okay with intermittent dosing.      Relevant Medications   amLODipine (NORVASC) 5 MG tablet   Hyponatremia    Last  sodium level was 135, doing very well.  We talked about maybe using as needed spironolactone, but she has not had any significant edema in I did not notice any improvement with spironolactone.  Would need to continue to follow, but I suspect that this was related to HCTZ.      Leg swelling (Chronic)    Ankle edema actually seems to be notably improved since stopping diuretic.  Support stockings seem to be helping.      Rapid palpitations (Chronic)    Relatively normal monitor.  I think especially that she is not used to having her heart rate increased.  She did not tolerate diltiazem.  Is on carvedilol at current dose and doing well with baseline bradycardia. Probably related to anxiety and/or caffeine.  We discussed ways to minimize stress induced palpitations.  -Notably increasing hydration and cutting back on triggers.         Current medicines are reviewed at length with the patient today. (+/- concerns) much better since being off diuretic.  Would like to have a an as needed medicine for high blood pressures.  Indicates that her adverse reaction to amlodipine did not occur with intermittent dosing and would like to try that.  She is still has some at home and is been using it. The following changes have been made:Add as needed amlodipine  Patient Instructions  Medications  --- as needed May take amlodipine 2.5 mg  1 to 2 tablets a week if blood pressure is greater than 170/90   no other changes.   Your physician wants you to follow-up in APRIL 2019  [place].You will receive a reminder letter in the mail two months in advance. If you don't receive a letter, please call our office to schedule the follow-up appointment.   If you need a refill on your cardiac medications before your next appointment, please call your pharmacy.     Studies Ordered:   No orders of the defined types were placed in  this encounter.     Glenetta Hew, M.D., M.S. Interventional  Cardiologist   Pager # [phone] Phone # [phone] [address] [place],  [address]   Thank you for choosing [facility] at [facility]!!

## 2016-12-15 ENCOUNTER — Encounter: Payer: Self-pay | Admitting: Cardiology

## 2016-12-15 NOTE — Assessment & Plan Note (Signed)
Blood pressure is high here today, but all of her home readings have been quite normal.  I am reluctant to act too much on this 1 blood pressure recording.  She has been pretty well controlled on the carvedilol and  irbesartan.  We will avoid diuretic for now. I agree that perhaps having a as needed medicine will be helpful.   Although not a typical as needed medicine, we will try as needed amlodipine 2.5 mg daily.  She has done okay with intermittent dosing.

## 2016-12-15 NOTE — Assessment & Plan Note (Signed)
Ankle edema actually seems to be notably improved since stopping diuretic.  Support stockings seem to be helping.

## 2016-12-15 NOTE — Assessment & Plan Note (Signed)
Relatively normal monitor.  I think especially that she is not used to having her heart rate increased.  She did not tolerate diltiazem.  Is on carvedilol at current dose and doing well with baseline bradycardia. Probably related to anxiety and/or caffeine.  We discussed ways to minimize stress induced palpitations.  -Notably increasing hydration and cutting back on triggers.

## 2016-12-15 NOTE — Assessment & Plan Note (Signed)
Last sodium level was 135, doing very well.  We talked about maybe using as needed spironolactone, but she has not had any significant edema in I did not notice any improvement with spironolactone.  Would need to continue to follow, but I suspect that this was related to HCTZ.

## 2017-01-03 HISTORY — PX: OTHER SURGICAL HISTORY: SHX169

## 2017-04-04 DIAGNOSIS — F419 Anxiety disorder, unspecified: Secondary | ICD-10-CM | POA: Diagnosis not present

## 2017-04-04 DIAGNOSIS — E7800 Pure hypercholesterolemia, unspecified: Secondary | ICD-10-CM | POA: Diagnosis not present

## 2017-04-04 DIAGNOSIS — I10 Essential (primary) hypertension: Secondary | ICD-10-CM | POA: Diagnosis not present

## 2017-04-04 DIAGNOSIS — Z131 Encounter for screening for diabetes mellitus: Secondary | ICD-10-CM | POA: Diagnosis not present

## 2017-04-04 DIAGNOSIS — E039 Hypothyroidism, unspecified: Secondary | ICD-10-CM | POA: Diagnosis not present

## 2017-04-26 DIAGNOSIS — G43109 Migraine with aura, not intractable, without status migrainosus: Secondary | ICD-10-CM | POA: Diagnosis not present

## 2017-05-03 DIAGNOSIS — L821 Other seborrheic keratosis: Secondary | ICD-10-CM | POA: Diagnosis not present

## 2017-05-03 DIAGNOSIS — L905 Scar conditions and fibrosis of skin: Secondary | ICD-10-CM | POA: Diagnosis not present

## 2017-05-03 DIAGNOSIS — L218 Other seborrheic dermatitis: Secondary | ICD-10-CM | POA: Diagnosis not present

## 2017-05-03 DIAGNOSIS — L718 Other rosacea: Secondary | ICD-10-CM | POA: Diagnosis not present

## 2017-05-11 DIAGNOSIS — Z0000 Encounter for general adult medical examination without abnormal findings: Secondary | ICD-10-CM | POA: Diagnosis not present

## 2017-05-11 DIAGNOSIS — G5602 Carpal tunnel syndrome, left upper limb: Secondary | ICD-10-CM | POA: Insufficient documentation

## 2017-06-30 DIAGNOSIS — H04552 Acquired stenosis of left nasolacrimal duct: Secondary | ICD-10-CM | POA: Diagnosis not present

## 2017-06-30 DIAGNOSIS — H02413 Mechanical ptosis of bilateral eyelids: Secondary | ICD-10-CM | POA: Diagnosis not present

## 2017-07-12 DIAGNOSIS — H02431 Paralytic ptosis of right eyelid: Secondary | ICD-10-CM | POA: Diagnosis not present

## 2017-07-12 DIAGNOSIS — Z01818 Encounter for other preprocedural examination: Secondary | ICD-10-CM | POA: Diagnosis not present

## 2017-07-12 DIAGNOSIS — H04552 Acquired stenosis of left nasolacrimal duct: Secondary | ICD-10-CM | POA: Diagnosis not present

## 2017-07-26 DIAGNOSIS — G43109 Migraine with aura, not intractable, without status migrainosus: Secondary | ICD-10-CM | POA: Diagnosis not present

## 2017-08-09 DIAGNOSIS — H04551 Acquired stenosis of right nasolacrimal duct: Secondary | ICD-10-CM | POA: Diagnosis not present

## 2017-08-09 DIAGNOSIS — H04552 Acquired stenosis of left nasolacrimal duct: Secondary | ICD-10-CM | POA: Diagnosis not present

## 2017-08-09 DIAGNOSIS — H02413 Mechanical ptosis of bilateral eyelids: Secondary | ICD-10-CM | POA: Diagnosis not present

## 2017-08-09 DIAGNOSIS — H02403 Unspecified ptosis of bilateral eyelids: Secondary | ICD-10-CM | POA: Diagnosis not present

## 2017-08-29 DIAGNOSIS — H43813 Vitreous degeneration, bilateral: Secondary | ICD-10-CM | POA: Diagnosis not present

## 2017-08-29 DIAGNOSIS — H35373 Puckering of macula, bilateral: Secondary | ICD-10-CM | POA: Diagnosis not present

## 2017-09-27 DIAGNOSIS — H43813 Vitreous degeneration, bilateral: Secondary | ICD-10-CM | POA: Diagnosis not present

## 2017-09-27 DIAGNOSIS — H35373 Puckering of macula, bilateral: Secondary | ICD-10-CM | POA: Diagnosis not present

## 2017-10-10 DIAGNOSIS — E039 Hypothyroidism, unspecified: Secondary | ICD-10-CM | POA: Diagnosis not present

## 2017-10-10 DIAGNOSIS — E7800 Pure hypercholesterolemia, unspecified: Secondary | ICD-10-CM | POA: Diagnosis not present

## 2017-10-10 DIAGNOSIS — Z131 Encounter for screening for diabetes mellitus: Secondary | ICD-10-CM | POA: Diagnosis not present

## 2017-10-10 DIAGNOSIS — I10 Essential (primary) hypertension: Secondary | ICD-10-CM | POA: Diagnosis not present

## 2017-10-10 DIAGNOSIS — F419 Anxiety disorder, unspecified: Secondary | ICD-10-CM | POA: Diagnosis not present

## 2017-10-23 DIAGNOSIS — H04551 Acquired stenosis of right nasolacrimal duct: Secondary | ICD-10-CM | POA: Diagnosis not present

## 2017-12-06 DIAGNOSIS — H43813 Vitreous degeneration, bilateral: Secondary | ICD-10-CM | POA: Diagnosis not present

## 2017-12-06 DIAGNOSIS — H35373 Puckering of macula, bilateral: Secondary | ICD-10-CM | POA: Diagnosis not present

## 2017-12-19 ENCOUNTER — Telehealth: Payer: Self-pay | Admitting: Internal Medicine

## 2017-12-19 NOTE — Telephone Encounter (Signed)
° ° °  Per staff message provide change approved

## 2017-12-20 ENCOUNTER — Encounter: Payer: Self-pay | Admitting: Internal Medicine

## 2017-12-20 ENCOUNTER — Ambulatory Visit: Payer: PPO | Admitting: Internal Medicine

## 2017-12-20 VITALS — BP 154/94 | HR 61 | Ht 66.0 in | Wt 220.6 lb

## 2017-12-20 DIAGNOSIS — G903 Multi-system degeneration of the autonomic nervous system: Secondary | ICD-10-CM | POA: Diagnosis not present

## 2017-12-20 DIAGNOSIS — G6289 Other specified polyneuropathies: Secondary | ICD-10-CM | POA: Diagnosis not present

## 2017-12-20 DIAGNOSIS — R0989 Other specified symptoms and signs involving the circulatory and respiratory systems: Secondary | ICD-10-CM

## 2017-12-20 MED ORDER — HYDRALAZINE HCL 25 MG PO TABS: 25.0000 mg | ORAL_TABLET | Freq: Two times a day (BID) | ORAL | 3 refills | Status: DC

## 2017-12-20 NOTE — Patient Instructions (Signed)
Medication Instructions:  Continue current medications If you need a refill on your cardiac medications before your next appointment, please call your pharmacy.    Follow-Up: At [facility], you and your health needs are our priority.  As part of our continuing mission to provide you with exceptional heart care, we have created designated Provider Care Teams.  These Care Teams include your primary Cardiologist (physician) and Advanced Practice Providers (APPs -  Physician Assistants and Nurse Practitioners) who all work together to provide you with the care you need, when you need it. You will need a follow up appointment in 6 months.  You may see Dr. Debara Pickett or one of the following Advanced Practice Providers on your designated Care Team: Almyra Deforest, [place] . Fabian Sharp, PA-C  Any Other Special Instructions Will Be Listed Below (If Applicable).

## 2017-12-20 NOTE — Progress Notes (Signed)
OFFICE NOTE  Chief Complaint:  Elevated blood pressure  Primary Care Physician: Algis Greenhouse, MD  HPI:  Alexandria Hayes is a 72 y.o. female with a past medial history significant for multiple medical problems including chronic fatigue, fibromyalgia, hypertension with orthostasis and labile blood pressures, migraine headaches and prior stroke.  She was previously seen by Dr. Ellyn Hack but transitioned care to me.  She is concerned about recent spikes in her blood pressure.  Previously she had been on a diuretic because of edema possibly related to amlodipine.  After stopping the diuretic she is felt better although her blood pressure is running higher.  She says she has some significant change in blood pressure which is positional and can drop more than 30 mmHg suggested of orthostasis.  She does have a history of neuropathy on gabapentin and has had prior stroke.  She takes amlodipine as needed for elevated blood pressures, but has side effects related to this.  Blood pressure today was 154/94.  She also has numerous medication intolerances including to amlodipine, clonidine, and other medications, mostly antibiotics.  We discussed some possible treatment options however she is quite hesitant to take additional medications due to her worry of side effects.  She also has a history of PTSD/depression and anxiety per her report.  PMHx:  Past Medical History:  Diagnosis Date  . Chronic fatigue disorder   . Essential hypertension   . History of closed head injury 05/2012   Skull fracture and left forehead; related to trauma  . Hypothyroid    On replacement therapy  . Migraines   . Stroke [facility])     Past Surgical History:  Procedure Laterality Date  . ABDOMINAL HYSTERECTOMY  12/2010  . BACK SURGERY  11/2007   L4-L5 fusion  . CATARACT EXTRACTION, BILATERAL  09/2009  . REPLACEMENT TOTAL KNEE BILATERAL Bilateral 2011   February and October    FAMHx:  Family History  Problem Relation  Age of Onset  . Breast cancer Mother   . Healthy Father        No chronic conditions listed  . Other Father   . Other Sister        no Med Hx Noted  . Other Brother        No medical history listed  . Lung cancer Maternal Grandfather   . Stroke Paternal Grandmother   . Heart disease Paternal Grandfather        Unsure of details  . Other Brother        no Med Hx noted  . Other Brother        no Med Hx noted    SOCHx:   reports that she has never smoked. She has never used smokeless tobacco. She reports that she does not drink alcohol or use drugs.  ALLERGIES:  Allergies  Allergen Reactions  . Amlodipine Rash    Blisters on gums  . Clonidine Other (See Comments)    GI Upset (intolerance) Dry mouth Dizziness  . Nitrofuran Derivatives Anaphylaxis, Swelling and Rash       . Triamcinolone Acetonide   . Ciprofloxacin Nausea Only  . Cortisone Anxiety and Hypertension  . Erythromycin Rash  . Levaquin [Levofloxacin In D5w] Anxiety and Hypertension  . Penicillins Rash    Has patient had a PCN reaction causing immediate rash, facial/tongue/throat swelling, SOB or lightheadedness with hypotension: Yes Has patient had a PCN reaction causing severe rash involving mucus membranes or skin necrosis: No Has patient  had a PCN reaction that required hospitalization No Has patient had a PCN reaction occurring within the last 10 years: No If all of the above answers are "NO", then may proceed with Cephalosporin use.   Marland Kitchen Prochlorperazine Edisylate Anxiety and Hypertension    hypertension  . Tetracyclines & Related Rash    ROS: Pertinent items noted in HPI and remainder of comprehensive ROS otherwise negative.  HOME MEDS: Current Outpatient Medications on File Prior to Visit  Medication Sig Dispense Refill  . ALPRAZolam (XANAX) 0.5 MG tablet Take 0.5 mg by mouth daily as needed for anxiety.    Marland Kitchen amLODipine (NORVASC) 5 MG tablet Take 1 to 2 tablets a week as needed for blood pressure  greater than 170/90 30 tablet 4  . Ascorbic Acid (VITAMIN C PO) Take 1 tablet by mouth daily.    Marland Kitchen aspirin EC 81 MG tablet Take 81 mg by mouth daily.     . calcium carbonate (TUMS - DOSED IN MG ELEMENTAL CALCIUM) 500 MG chewable tablet Chew 1 tablet by mouth daily as needed for indigestion or heartburn.     . carvedilol (COREG) 12.5 MG tablet Take 12.5 mg by mouth 2 (two) times daily with a meal.    . Cholecalciferol (D-3-5) 5000 units capsule Take 5,000 Units by mouth daily.    Marland Kitchen co-enzyme Q-10 50 MG capsule Take 100 mg by mouth daily.    Marland Kitchen gabapentin (NEURONTIN) 300 MG capsule Take 300 mg by mouth at bedtime.    . irbesartan (AVAPRO) 300 MG tablet Take 300 mg by mouth at bedtime.    . Levothyroxine Sodium 88 MCG CAPS Take 88 mcg by mouth every morning.    Marland Kitchen MAGNESIUM PO Take 150 mg by mouth daily. Powder Form    . Multiple Vitamin (MULTIVITAMIN WITH MINERALS) TABS tablet Take 1 tablet by mouth daily.    . naproxen sodium (ANAPROX) 220 MG tablet Take 440 mg by mouth daily as needed (pain).    . Probiotic Product (PROBIOTIC PO) Take 1 tablet by mouth daily.     No current facility-administered medications on file prior to visit.     LABS/IMAGING: No results found for this or any previous visit (from the past 48 hour(s)). No results found.  LIPID PANEL: No results found for: CHOL, TRIG, HDL, CHOLHDL, VLDL, LDLCALC, LDLDIRECT   WEIGHTS: Wt Readings from Last 3 Encounters:  12/20/17 220 lb 9.6 oz (100.1 kg)  12/14/16 218 lb (98.9 kg)  09/27/16 219 lb (99.3 kg)    VITALS: BP (!) 154/94   Pulse 61   Ht 5\' 6"  (1.676 m)   Wt 220 lb 9.6 oz (100.1 kg)   BMI 35.61 kg/m   EXAM: General appearance: alert, no distress and moderately obese Neck: no carotid bruit, no JVD and thyroid not enlarged, symmetric, no tenderness/mass/nodules Lungs: clear to auscultation bilaterally Heart: regular rate and rhythm Abdomen: soft, non-tender; bowel sounds normal; no masses,  no  organomegaly Extremities: extremities normal, atraumatic, no cyanosis or edema Pulses: 2+ and symmetric Skin: Skin color, texture, turgor normal. 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 Neuropathy 3. Migraine headache 4. Fibromyalgia 5. Labile blood pressures 6. PTSD/anxiety/depression 7. Multiple medication intolerances  PLAN: 1.   Ms. Gagan has multiple medication intolerances which will make it difficult to treat her spiking blood pressures.  These may be driven  by pain, headache, anxiety or possibly neurogenic causes.  She is having orthostatic symptoms and was significantly worse on a diuretic.  We have to be careful about other medications that could worsen this.  We could consider short acting hydralazine although as a vasodilator could worsen symptoms.  It may however be better tolerated than amlodipine.  Otherwise there are few additional options which may be beneficial for her.  I suspect treatment of her primary neurologic condition would be of most benefit.  She can continue to wear lower extremity compression stockings.  Follow-up with me in 6 months.  Pixie Casino, MD, [facility], [place] Director of the Advanced Lipid Disorders &  Cardiovascular Risk Reduction Clinic Diplomate of the American Board of Clinical Lipidology Attending Cardiologist  Direct Dial: [phone]  Fax: [phone]  Website:  [email or web address] 12/20/2017, 5:33 PM

## 2018-02-09 DIAGNOSIS — F3341 Major depressive disorder, recurrent, in partial remission: Secondary | ICD-10-CM | POA: Diagnosis not present

## 2018-02-14 ENCOUNTER — Other Ambulatory Visit: Payer: Self-pay | Admitting: Cardiology

## 2018-06-27 DIAGNOSIS — E7800 Pure hypercholesterolemia, unspecified: Secondary | ICD-10-CM | POA: Diagnosis not present

## 2018-06-27 DIAGNOSIS — I10 Essential (primary) hypertension: Secondary | ICD-10-CM | POA: Diagnosis not present

## 2018-06-27 DIAGNOSIS — F419 Anxiety disorder, unspecified: Secondary | ICD-10-CM | POA: Diagnosis not present

## 2018-06-27 DIAGNOSIS — Z0000 Encounter for general adult medical examination without abnormal findings: Secondary | ICD-10-CM | POA: Diagnosis not present

## 2018-06-27 DIAGNOSIS — I69959 Hemiplegia and hemiparesis following unspecified cerebrovascular disease affecting unspecified side: Secondary | ICD-10-CM | POA: Diagnosis not present

## 2019-02-27 DIAGNOSIS — E559 Vitamin D deficiency, unspecified: Secondary | ICD-10-CM | POA: Diagnosis not present

## 2019-02-27 DIAGNOSIS — R7301 Impaired fasting glucose: Secondary | ICD-10-CM | POA: Diagnosis not present

## 2019-02-27 DIAGNOSIS — I10 Essential (primary) hypertension: Secondary | ICD-10-CM | POA: Diagnosis not present

## 2019-02-27 DIAGNOSIS — R011 Cardiac murmur, unspecified: Secondary | ICD-10-CM | POA: Insufficient documentation

## 2019-02-27 DIAGNOSIS — F419 Anxiety disorder, unspecified: Secondary | ICD-10-CM | POA: Diagnosis not present

## 2019-02-27 DIAGNOSIS — E039 Hypothyroidism, unspecified: Secondary | ICD-10-CM | POA: Diagnosis not present

## 2019-02-27 DIAGNOSIS — E7800 Pure hypercholesterolemia, unspecified: Secondary | ICD-10-CM | POA: Diagnosis not present

## 2019-05-06 ENCOUNTER — Other Ambulatory Visit: Payer: Self-pay | Admitting: Internal Medicine

## 2019-05-15 DIAGNOSIS — M9905 Segmental and somatic dysfunction of pelvic region: Secondary | ICD-10-CM | POA: Diagnosis not present

## 2019-05-15 DIAGNOSIS — M542 Cervicalgia: Secondary | ICD-10-CM | POA: Diagnosis not present

## 2019-05-15 DIAGNOSIS — M9902 Segmental and somatic dysfunction of thoracic region: Secondary | ICD-10-CM | POA: Diagnosis not present

## 2019-05-15 DIAGNOSIS — M9901 Segmental and somatic dysfunction of cervical region: Secondary | ICD-10-CM | POA: Diagnosis not present

## 2019-05-16 DIAGNOSIS — H43813 Vitreous degeneration, bilateral: Secondary | ICD-10-CM | POA: Diagnosis not present

## 2019-05-16 DIAGNOSIS — H35373 Puckering of macula, bilateral: Secondary | ICD-10-CM | POA: Diagnosis not present

## 2019-05-16 DIAGNOSIS — H30041 Focal chorioretinal inflammation, macular or paramacular, right eye: Secondary | ICD-10-CM | POA: Diagnosis not present

## 2019-05-20 DIAGNOSIS — H43819 Vitreous degeneration, unspecified eye: Secondary | ICD-10-CM | POA: Insufficient documentation

## 2019-05-20 DIAGNOSIS — Z79899 Other long term (current) drug therapy: Secondary | ICD-10-CM | POA: Diagnosis not present

## 2019-05-20 DIAGNOSIS — H30041 Focal chorioretinal inflammation, macular or paramacular, right eye: Secondary | ICD-10-CM | POA: Insufficient documentation

## 2019-05-20 DIAGNOSIS — H35379 Puckering of macula, unspecified eye: Secondary | ICD-10-CM | POA: Insufficient documentation

## 2019-05-20 DIAGNOSIS — Z7982 Long term (current) use of aspirin: Secondary | ICD-10-CM | POA: Diagnosis not present

## 2019-05-22 DIAGNOSIS — I6523 Occlusion and stenosis of bilateral carotid arteries: Secondary | ICD-10-CM | POA: Insufficient documentation

## 2019-05-22 DIAGNOSIS — H30041 Focal chorioretinal inflammation, macular or paramacular, right eye: Secondary | ICD-10-CM | POA: Diagnosis not present

## 2019-06-06 DIAGNOSIS — H524 Presbyopia: Secondary | ICD-10-CM | POA: Diagnosis not present

## 2019-06-06 DIAGNOSIS — Z961 Presence of intraocular lens: Secondary | ICD-10-CM | POA: Diagnosis not present

## 2019-06-06 DIAGNOSIS — H40003 Preglaucoma, unspecified, bilateral: Secondary | ICD-10-CM | POA: Diagnosis not present

## 2019-06-14 ENCOUNTER — Telehealth: Payer: PPO | Admitting: Physician Assistant

## 2019-06-25 ENCOUNTER — Telehealth (INDEPENDENT_AMBULATORY_CARE_PROVIDER_SITE_OTHER): Payer: PPO | Admitting: Physician Assistant

## 2019-06-25 ENCOUNTER — Telehealth: Payer: Self-pay

## 2019-06-25 DIAGNOSIS — I10 Essential (primary) hypertension: Secondary | ICD-10-CM | POA: Diagnosis not present

## 2019-06-25 DIAGNOSIS — R011 Cardiac murmur, unspecified: Secondary | ICD-10-CM

## 2019-06-25 DIAGNOSIS — H30049 Focal chorioretinal inflammation, macular or paramacular, unspecified eye: Secondary | ICD-10-CM | POA: Diagnosis not present

## 2019-06-25 IMAGING — CT CT HEAD W/O CM
4 series · 17 of 47 positions shown, 19 images · non-contrast
Comparison: MRI 03/19/2016

CLINICAL DATA: Left facial and left arm numbness for 3 days

EXAM:
CT HEAD WITHOUT CONTRAST
TECHNIQUE: Contiguous axial images were obtained from the base of the skull
through the vertex without intravenous contrast.

[Series 3: head without · axial · non-contrast · 0.42mm/px · z∈[-100,+20]mm · 7 of 33 slices shown, 9 images]
[im 5/33  brain]
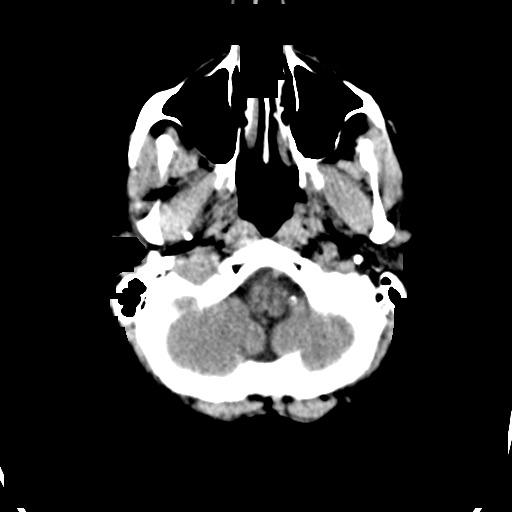
[im 5/33  bone]
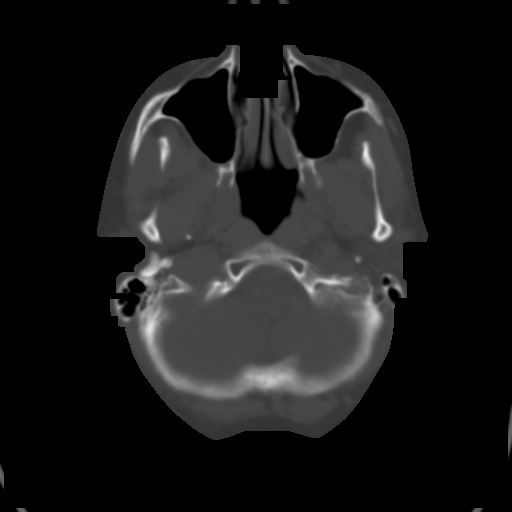
[im 9/33  brain]
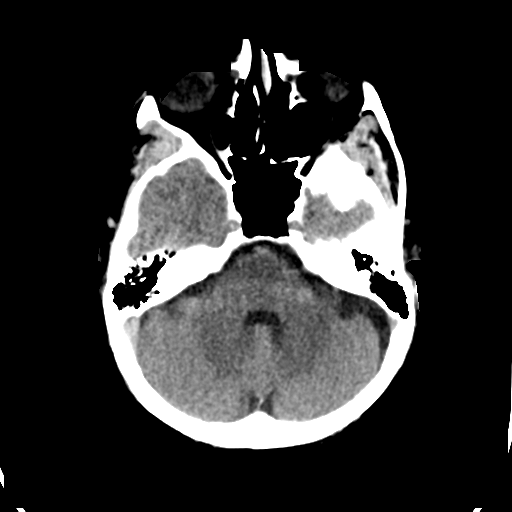
[im 13/33  brain]
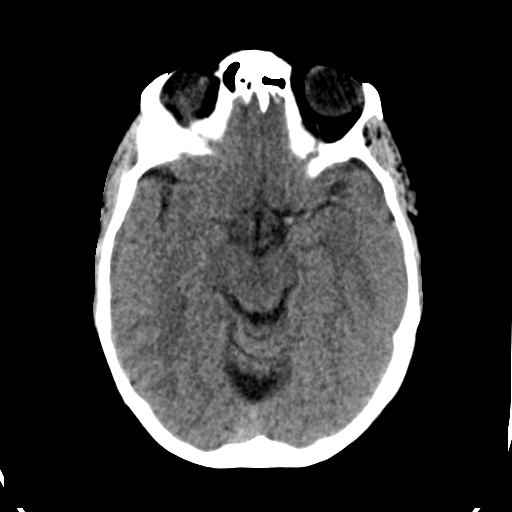
[im 17/33  brain]
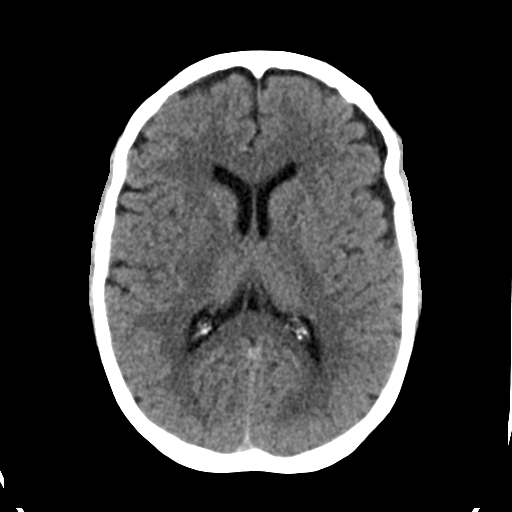
[im 21/33  brain]
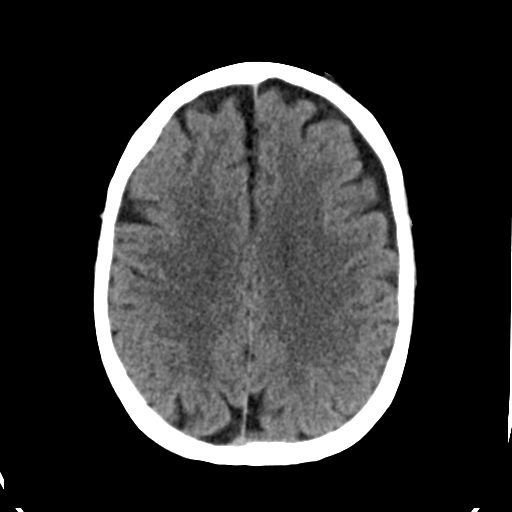
[im 21/33  bone]
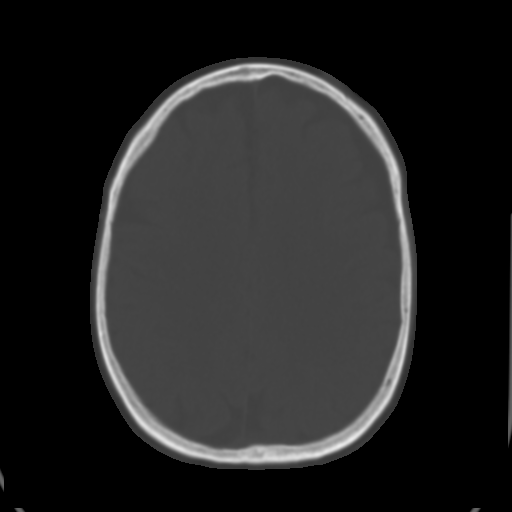
[im 25/33  brain]
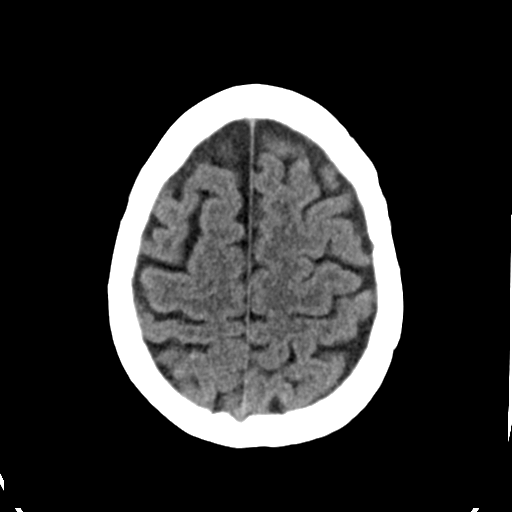
[im 29/33  brain]
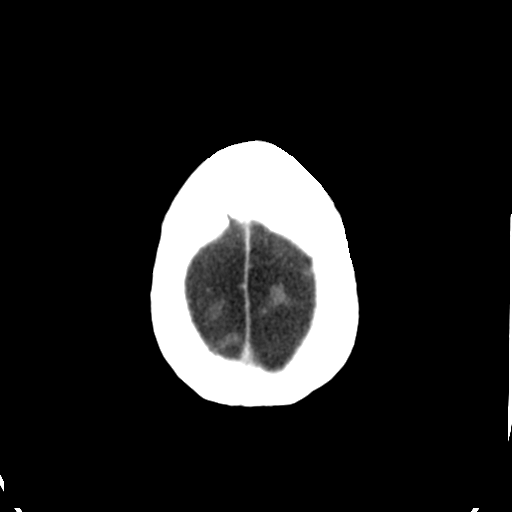

[Series 4: head bone · axial · 0.42mm/px · z∈[-104,-48]mm · 4 of 83 slices shown]
[im 9/83  bone]
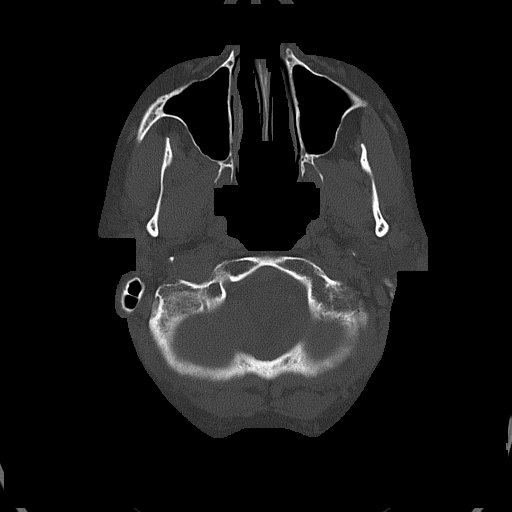
[im 17/83  bone]
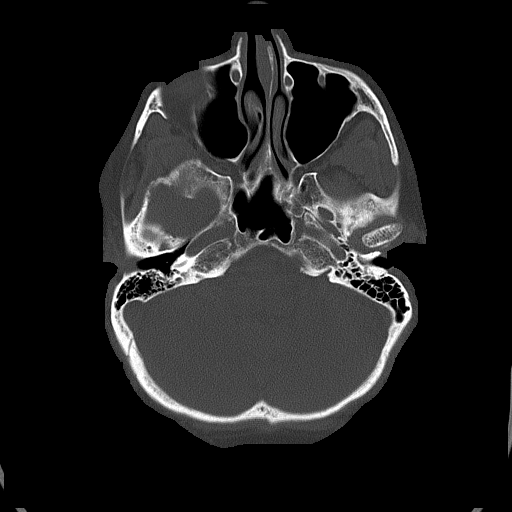
[im 25/83  bone]
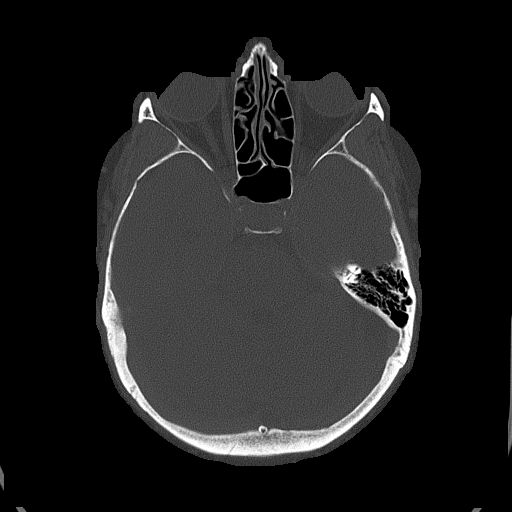
[im 37/83  bone]
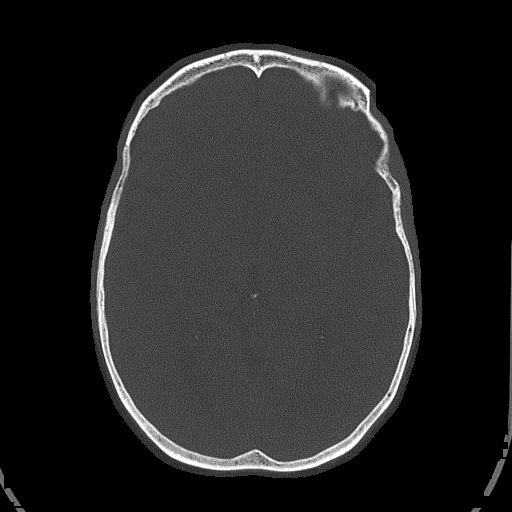

[Series 5: head without cor · coronal · non-contrast · 0.32mm/px · 3 of 67 slices shown]
[im 23/67  brain]
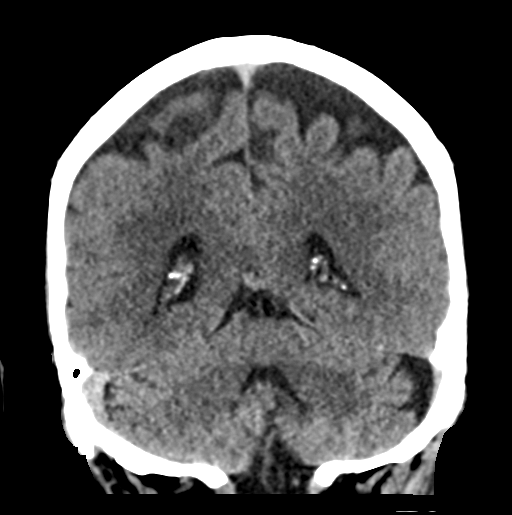
[im 30/67  brain]
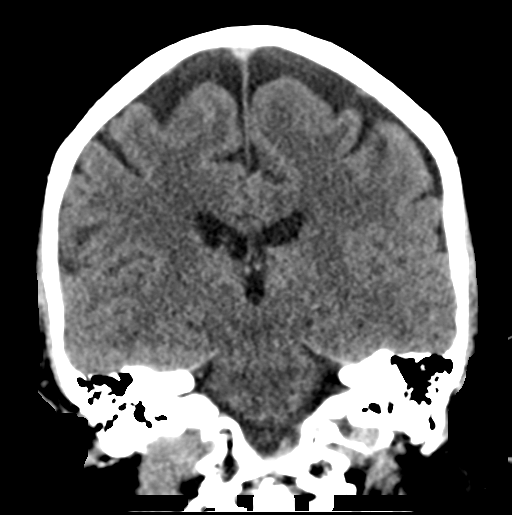
[im 37/67  brain]
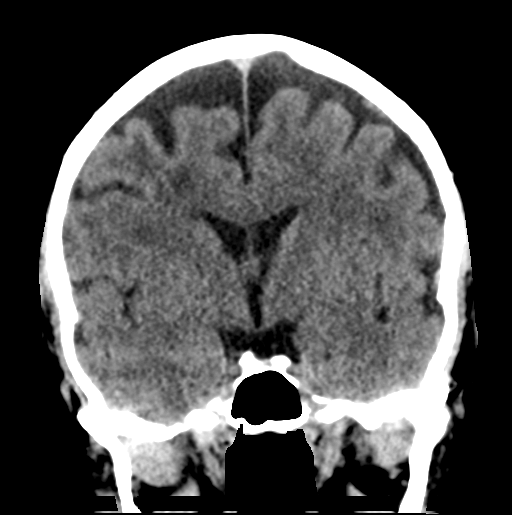

[Series 6: head without sag · sagittal · non-contrast · 0.32mm/px · 3 of 55 slices shown]
[im 19/55  brain]
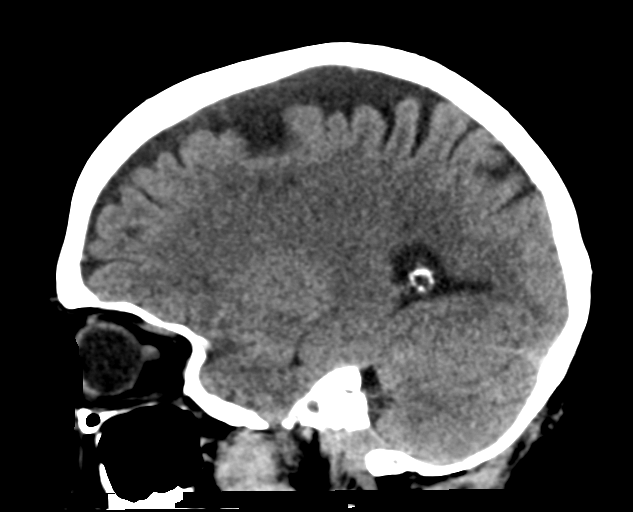
[im 28/55  brain]
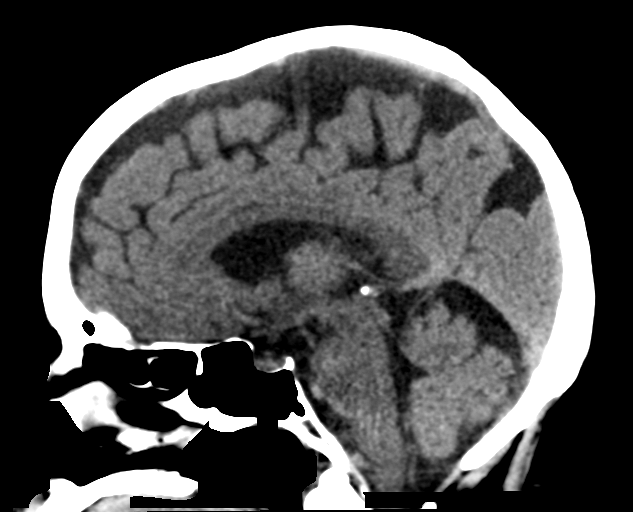
[im 37/55  brain]
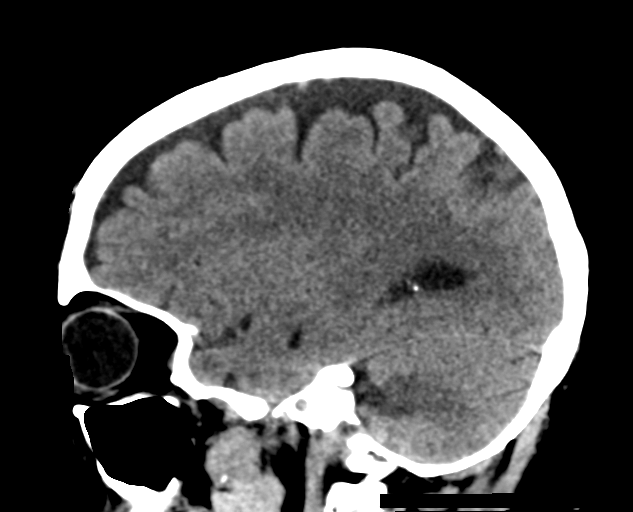

[17 of 47 positions shown; findings below may reference images not displayed]

FINDINGS: Brain: There is atrophy and chronic small vessel disease changes. No
acute intracranial abnormality. Specifically, no hemorrhage,
hydrocephalus, mass lesion, acute infarction, or significant
intracranial injury.

Vascular: No hyperdense vessel or unexpected calcification.

Skull: No acute calvarial abnormality.

Sinuses/Orbits: Visualized paranasal sinuses and mastoids clear.
Orbital soft tissues unremarkable.

Other: None
IMPRESSION: No acute intracranial abnormality.

Atrophy, chronic microvascular disease.

## 2019-06-25 NOTE — Patient Instructions (Signed)
Medication Instructions:  Your physician recommends that you continue on your current medications as directed. Please refer to the Current Medication list given to you today.  *If you need a refill on your cardiac medications before your next appointment, please call your pharmacy*  Lab Work: NONE ordered at this time of appointment   If you have labs (blood work) drawn today and your tests are completely normal, you will receive your results only by: Marland Kitchen MyChart Message (if you have MyChart) OR . A paper copy in the mail If you have any lab test that is abnormal or we need to change your treatment, we will call you to review the results.  Testing/Procedures: Your physician has requested that you have an echocardiogram. Echocardiography is a painless test that uses sound waves to create images of your heart. It provides your doctor with information about the size and shape of your heart and how well your heart's chambers and valves are working. This procedure takes approximately one hour. There are no restrictions for this procedure. This test is done at [address], [address]   Please schedule for 2-3 weeks   Follow-Up: At [facility], you and your health needs are our priority.  As part of our continuing mission to provide you with exceptional heart care, we have created designated Provider Care Teams.  These Care Teams include your primary Cardiologist (physician) and Advanced Practice Providers (APPs -  Physician Assistants and Nurse Practitioners) who all work together to provide you with the care you need, when you need it.  Your next appointment:   1 month(s)  The format for your next appointment:   In Person  Provider:   Raliegh Ip Mali Hilty, MD or Almyra Deforest, PA-C  Other Instructions

## 2019-06-25 NOTE — Telephone Encounter (Signed)
Called and left a detail message for the patient with AVS instructions about Echocardiogram being ordered,1 month follow up with Dr. Debara Pickett or Almyra Deforest, PA-C to be scheduled. I stated that someone from scheduling will give her a call to get those appointments scheduled.

## 2019-06-25 NOTE — Progress Notes (Signed)
Virtual Visit via Video Note   This visit type was conducted due to national recommendations for restrictions regarding the COVID-19 Pandemic (e.g. social distancing) in an effort to limit this patient's exposure and mitigate transmission in our community.  Due to her co-morbid illnesses, this patient is at least at moderate risk for complications without adequate follow up.  This format is felt to be most appropriate for this patient at this time.  All issues noted in this document were discussed and addressed.  A limited physical exam was performed with this format.  Please refer to the patient's chart for her consent to telehealth for [facility].   The patient was identified using 2 identifiers.  Date:  06/25/2019   ID:  Alexandria Hayes, DOB 20-Jun-1945, MRN [id number]  Patient Location: Home Provider Location: Home  PCP:  Algis Greenhouse, MD  Cardiologist:  Pixie Casino, MD Electrophysiologist:  None   Evaluation Performed:  Follow-Up Visit  Chief Complaint:  Followup  History of Present Illness:    Alexandria Hayes is a 74 y.o. female with past medical history of chronic fatigue, fibromyalgia, hypertension with orthostasis, labile BP, migraine headache and history of stroke.  Both her husband and her Jehov[employer]'s Witness.  Her husband is also a patient of Dr. Debara Pickett.  Patient was previously seen by Dr. Ellyn Hack however switched to Dr. Debara Pickett later.  She was last seen by Dr. Debara Pickett in December 2020 for labile hypertension.  She has multiple medication intolerances including amlodipine, clonidine and antibiotics.  Dr. Debara Pickett was hesitant to prescribe any diuretic which may worsen her orthostatic symptoms.  She previously wore a heart monitor in October 2018 which showed sinus bradycardia with heart rate 59 bpm, occasional PVCs otherwise no significant finding.  Patient presents today for cardiology office visit.  Her blood pressure is fairly controlled on carvedilol and irbesartan.   Average blood pressure has been around 140/90.  Heart rate between 57 to 63 bpm.  Recently, she was evaluated by Dr. Dennie Fetters of [facility].  She was diagnosed with macular focal retinitis.  Her eye doctor recommended carotid Doppler and echocardiogram.  She has since been seen by her PCP Dr. Garlon Hatchet who ordered a carotid Doppler.  Carotid duplex was fairly normal with minimal plaque in the carotid artery, no significant stenosis was noted, antegrade vertebral flow.  We will proceed with echocardiogram.  I will reach out to Dr. Latanya Maudlin office to see if there is any evidence of arterial thrombosis, if no arterial thrombosis was seen on the recent eye exam, then the patient likely does not need a TEE or heart monitor.  Otherwise, she can follow-up with Dr. Debara Pickett in 1 to 2 months.  The patient does not have symptoms concerning for COVID-19 infection (fever, chills, cough, or new shortness of breath).    Past Medical History:  Diagnosis Date  . Chronic fatigue disorder   . Essential hypertension   . History of closed head injury 05/2012   Skull fracture and left forehead; related to trauma  . Hypothyroid    On replacement therapy  . Migraines   . Stroke [facility])    Past Surgical History:  Procedure Laterality Date  . ABDOMINAL HYSTERECTOMY  12/2010  . BACK SURGERY  11/2007   L4-L5 fusion  . CATARACT EXTRACTION, BILATERAL  09/2009  . REPLACEMENT TOTAL KNEE BILATERAL Bilateral 2011   February and October     No outpatient medications have been marked as taking for  the 06/25/19 encounter (Appointment) with Almyra Deforest, [place].     Allergies:   Amlodipine, Clonidine, Nitrofuran derivatives, Triamcinolone acetonide, Ciprofloxacin, Cortisone, Erythromycin, Levaquin [levofloxacin in d5w], Penicillins, Prochlorperazine edisylate, and Tetracyclines & related   Social History   Tobacco Use  . Smoking status: Never Smoker  . Smokeless tobacco: Never Used  Substance Use Topics  . Alcohol use: No   . Drug use: No     Family Hx: The patient's family history includes Breast cancer in her mother; Healthy in her father; Heart disease in her paternal grandfather; Lung cancer in her maternal grandfather; Other in her brother, brother, brother, father, and sister; Stroke in her paternal grandmother.  ROS:   Please see the history of present illness.     All other systems reviewed and are negative.   Prior CV studies:   The following studies were reviewed today:  Event monitor 11/02/2016  30-day event monitor: 4 stable events (3 manual)  Baseline rhythm sinus rhythm - Average heart rate 64 bpm  Minimum heart rate 42 bpm (sinus bradycardia), maximum heart rate 103 bpm (sinus tachycardia). .  Rare PVCs but no significant arrhythmias noted.  Symptoms of rapid/fast heartbeat or skipped heartbeat noted with sinus rhythm as well as sinus rhythm with PVCs.   Summary: The patient's monitoring period was 11/02/2016 - 12/01/2016. Baseline sample showed Sinus Bradycardia w/Artifact with a heart rate of 59.7 bpm.  There were 0 critical, 0 serious, and 4 stable events that occurred. The report analysis of the critical, serious, stable and manually triggered events are listed below. Manually Detected Events: 1 Stable: Sinus Rhythm . Rapid or Fast Heartbeat 1 Stable: Sinus Rhythm w/PVCs (1 in 1/Min)/Artifact . Rapid or Fast Heartbeat; Flutter or Skipped Beats 1 Stable: Sinus Rhythm w/PVCs (2 in 1/Min)/Artifact . Flutter or Skipped Beats Automatically Detected Events: 1 Stable: Sinus Bradycardia w/Artifact End of summarized findings.    Labs/Other Tests and Data Reviewed:    EKG:  An ECG dated 12/20/2017 was personally reviewed today and demonstrated:  Normal sinus rhythm, poor R wave progression in the anterior leads.  Recent Labs: No results found for requested labs within last 8760 hours.   Recent Lipid Panel No results found for: CHOL, TRIG, HDL, CHOLHDL, LDLCALC,  LDLDIRECT  Wt Readings from Last 3 Encounters:  12/20/17 220 lb 9.6 oz (100.1 kg)  12/14/16 218 lb (98.9 kg)  09/27/16 219 lb (99.3 kg)     Objective:    Vital Signs:  There were no vitals taken for this visit.   VITAL SIGNS:  reviewed  ASSESSMENT & PLAN:    1. Heart murmur: See #3, echocardiogram recommended by her eye doctor.  She also had a history of heart murmur as well.  I will reach out to her eye doctor to see if there was any sign of arterial thrombosis, as long as there is no sign of arterial thrombosis, she will not need TEE or repeat heart monitor again.  2. Hypertension: Continue on current therapy  3. Macular focal retinitis: Patient was recently seen by her eye doctor who recommended carotid Doppler and echocardiogram.  Carotid Doppler was ordered by her PCP which was negative for significant disease.  I will proceed with echocardiogram  COVID-19 Education: The signs and symptoms of COVID-19 were discussed with the patient and how to seek care for testing (follow up with PCP or arrange E-visit).  The importance of social distancing was discussed today.  Time:   Today, I have spent 30  minutes with the patient with telehealth technology discussing the above problems.     Medication Adjustments/Labs and Tests Ordered: Current medicines are reviewed at length with the patient today.  Concerns regarding medicines are outlined above.   Tests Ordered: No orders of the defined types were placed in this encounter.   Medication Changes: No orders of the defined types were placed in this encounter.   Follow Up:  Either In Person or Virtual in 2 month(s)  Signed, Almyra Deforest, [place]  06/25/2019 11:54 AM    [place]

## 2019-06-26 DIAGNOSIS — H40003 Preglaucoma, unspecified, bilateral: Secondary | ICD-10-CM | POA: Diagnosis not present

## 2019-07-18 ENCOUNTER — Other Ambulatory Visit (HOSPITAL_COMMUNITY): Payer: PPO

## 2019-08-13 ENCOUNTER — Ambulatory Visit: Payer: PPO | Admitting: Physician Assistant

## 2019-08-20 ENCOUNTER — Ambulatory Visit (HOSPITAL_COMMUNITY): Payer: PPO | Attending: Internal Medicine

## 2019-08-20 ENCOUNTER — Other Ambulatory Visit: Payer: Self-pay

## 2019-08-20 DIAGNOSIS — R011 Cardiac murmur, unspecified: Secondary | ICD-10-CM | POA: Insufficient documentation

## 2019-08-20 LAB — ECHOCARDIOGRAM COMPLETE
AV Area mean vel: 1.53 cm2
Area-P 1/2: 2.66 cm2
S' Lateral: 3 cm

## 2019-08-22 ENCOUNTER — Ambulatory Visit: Payer: PPO | Admitting: Physician Assistant

## 2019-08-27 ENCOUNTER — Other Ambulatory Visit (HOSPITAL_COMMUNITY): Payer: PPO

## 2019-08-30 ENCOUNTER — Telehealth: Payer: Self-pay

## 2019-08-30 NOTE — Telephone Encounter (Signed)
Alexandria Hayes called wanting to be set up for vaccine component testing.  She believes she might have an allergy to PEG.  She once had to go to the emergency room with throat closing after taking an antibiotic coated with PEG.  She also had a reaction after receiving a cortisone injection which included heart racing, dizziness, increased anxiety, and high blood pressure.  She then had a TIA/stroke day or two later.  She is interested in getting vaccinated but wants to make sure she is not allergic to PEG first.

## 2019-08-30 NOTE — Telephone Encounter (Signed)
Excellent.  Lets go ahead and have her scheduled.  Salvatore Marvel, MD [facility] [place]

## 2019-09-02 ENCOUNTER — Other Ambulatory Visit: Payer: Self-pay

## 2019-09-02 ENCOUNTER — Ambulatory Visit: Payer: PPO | Admitting: Allergy & Immunology

## 2019-09-02 ENCOUNTER — Encounter: Payer: Self-pay | Admitting: Allergy & Immunology

## 2019-09-02 ENCOUNTER — Ambulatory Visit: Payer: PPO

## 2019-09-02 VITALS — BP 158/120 | HR 70 | Resp 18 | Ht 66.34 in | Wt 216.0 lb

## 2019-09-02 DIAGNOSIS — R21 Rash and other nonspecific skin eruption: Secondary | ICD-10-CM | POA: Diagnosis not present

## 2019-09-02 DIAGNOSIS — Z23 Encounter for immunization: Secondary | ICD-10-CM | POA: Diagnosis not present

## 2019-09-02 DIAGNOSIS — R42 Dizziness and giddiness: Secondary | ICD-10-CM | POA: Diagnosis not present

## 2019-09-02 DIAGNOSIS — Z881 Allergy status to other antibiotic agents status: Secondary | ICD-10-CM | POA: Diagnosis not present

## 2019-09-02 DIAGNOSIS — T50Z95D Adverse effect of other vaccines and biological substances, subsequent encounter: Secondary | ICD-10-CM

## 2019-09-02 NOTE — Progress Notes (Signed)
NEW PATIENT  Date of Service/Encounter:  09/02/19  Referring provider: Algis Greenhouse, MD   Assessment:   Vaccines and biological substances causing adverse effect - with negative testing to the COVID19 components and tolerance of dose #1 of Pfizer  Multiple antibiotic allergies - needs various challenges +/- testing to remove these labels  Plan/Recommendations:   1. Vaccines and biological substances causing adverse effect - Testing today was negative, including the challenge to the Miralax (PEG). - There is currently no definite skin testing available for the Pfizer and Moderna COVID vaccines and data is limited however, there has been a concern regarding sensitivity to PEG in patients who had anaphylactic reactions to the COVID-19 vaccine. - Skin testing was negative to Miralax (source of PEG 3350), methylprednisolone acetate (source of PEG 3350), and triamcinolone acetonide (source of polysorbate-80). - You tolerated the Pfzier vaccine challenge in the clinic today. - Make an appointment on your way out for your second Pfizer vaccine in three weeks.   2. Multiple antibiotic allergies - Make an appointment for skin testing to penicillin. - We can then do a penicillin challenge to take this one off of your allergy list. - 90% of people with penicillin allergies lose them over the course of 10 years.   3. Return in about 6 weeks (around 10/14/2019) for PENICILLIN TESTING.   Subjective:   Alexandria Hayes is a 74 y.o. female presenting today for evaluation of  Chief Complaint  Patient presents with  . Food/Drug Challenge    Componet Testing    Alexandria Hayes has a history of the following: Patient Active Problem List   Diagnosis Date Noted  . Essential hypertension 09/27/2016  . Rapid palpitations 09/27/2016  . Leg swelling 09/27/2016  . Hyponatremia 09/27/2016    History obtained from: chart review and patient and her husband.  Alexandria Hayes was referred by  Algis Greenhouse, MD.     Alexandria Hayes is a 74 y.o. 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  15 years ago, she had a cortisone shot in her knee. She had immediate heart palpitations, dizziness, and a sensation of passing out. She felt an anxiety coming on. Her BP shot up. She sat in the room for a while and felt awful. She told the nurse that she was having a reaction. She was told to wait in the lobby instead of seeing a doctor. She stayed in the lobby for two hours. Her blood pressure was "high" and she never saw the doctor. She had a TIA the next day and then a stroke then following day. She was never admitted to the hospital. She had an MRI to diagnose this. This was all done in [place].   She has not had vaccines since that time. She had a fracture of her head in May 2014. She had a CT of he head and had a hairline fracture. She had a DTaP shot at that time and had a sore arm. Otherwise, she has not had a vaccine at all. She was doing some evaluation of her cortisone shot and noticed that there was PEG in it. She had flu vaccine around 20 years ago. She tolerated this fine at the time.   She has a lot of antibiotic allergies. She has not had it in 2-3 years. She had penicillin allergy diagnosed when she was breastfeeding around 40 years ago. She has not had any penicillin since that time. She gets rashes with a lot of  antibiotics. She had anaphylaxis with nitrofuantoin, and she read that there was PEG in the tablet coating. So she thinks this might be related to her PEG allergy.   She has generalized anxiety and has BP changes with that. She has Xanax today and is willing to take a dose if needed.   Otherwise, there is no history of other atopic diseases, including asthma, food allergies, environmental allergies, stinging insect allergies, eczema, urticaria or contact dermatitis. There is no significant infectious history. Vaccinations are up to date.    Past Medical History: Patient Active  Problem List   Diagnosis Date Noted  . Essential hypertension 09/27/2016  . Rapid palpitations 09/27/2016  . Leg swelling 09/27/2016  . Hyponatremia 09/27/2016    Medication List:  Allergies as of 09/02/2019      Reactions   Amlodipine Rash   Blisters on gums   Clonidine Other (See Comments)   GI Upset (intolerance) Dry mouth Dizziness   Nitrofuran Derivatives Anaphylaxis, Swelling, Rash      Triamcinolone Acetonide    Ciprofloxacin Nausea Only   Cortisone Anxiety, Hypertension   Erythromycin Rash   Levaquin [levofloxacin In D5w] Anxiety, Hypertension   Penicillins Rash   Has patient had a PCN reaction causing immediate rash, facial/tongue/throat swelling, SOB or lightheadedness with hypotension: Yes Has patient had a PCN reaction causing severe rash involving mucus membranes or skin necrosis: No Has patient had a PCN reaction that required hospitalization No Has patient had a PCN reaction occurring within the last 10 years: No If all of the above answers are "NO", then may proceed with Cephalosporin use.   Prochlorperazine Edisylate Anxiety, Hypertension   hypertension   Tetracyclines & Related Rash      Medication List       Accurate as of September 02, 2019 10:58 PM. If you have any questions, ask your nurse or doctor.        ALPRAZolam 0.5 MG tablet Commonly known as: XANAX Take 0.5 mg by mouth daily as needed for anxiety.   amLODipine 5 MG tablet Commonly known as: NORVASC TAKE 1 TO 2 TABLETS BY MOUTH ONCE A WEEK FOR BLOOD PRESSURE OVER 170/90   aspirin EC 81 MG tablet Take 81 mg by mouth daily.   calcium carbonate 500 MG chewable tablet Commonly known as: TUMS - dosed in mg elemental calcium Chew 1 tablet by mouth daily as needed for indigestion or heartburn.   carvedilol 12.5 MG tablet Commonly known as: COREG Take 12.5 mg by mouth 2 (two) times daily with a meal.   co-enzyme Q-10 50 MG capsule Take 100 mg by mouth daily.   D-3-5 125 MCG (5000 UT)  capsule Generic drug: Cholecalciferol Take 5,000 Units by mouth daily.   gabapentin 300 MG capsule Commonly known as: NEURONTIN Take 300 mg by mouth at bedtime.   hydrALAZINE 25 MG tablet Commonly known as: APRESOLINE Take 1 tablet (25 mg total) by mouth 2 (two) times daily.   irbesartan 300 MG tablet Commonly known as: AVAPRO Take 300 mg by mouth at bedtime.   Levothyroxine Sodium 88 MCG Caps Take 88 mcg by mouth every morning.   MAGNESIUM PO Take 150 mg by mouth daily. Powder Form   multivitamin with minerals Tabs tablet Take 1 tablet by mouth daily.   naproxen sodium 220 MG tablet Commonly known as: ALEVE Take 440 mg by mouth daily as needed (pain).   PROBIOTIC PO Take 1 tablet by mouth daily.   VITAMIN C PO Take 1  tablet by mouth daily.       Birth History: non-contributory  Developmental History: non-contributory  Past Surgical History: Past Surgical History:  Procedure Laterality Date  . ABDOMINAL HYSTERECTOMY  12/2010  . BACK SURGERY  11/2007   L4-L5 fusion  . CATARACT EXTRACTION, BILATERAL  09/2009  . REPLACEMENT TOTAL KNEE BILATERAL Bilateral 2011   February and October     Family History: Family History  Problem Relation Age of Onset  . Breast cancer Mother   . Healthy Father        No chronic conditions listed  . Other Father   . Other Sister        no Med Hx Noted  . Other Brother        No medical history listed  . Lung cancer Maternal Grandfather   . Stroke Paternal Grandmother   . Heart disease Paternal Grandfather        Unsure of details  . Other Brother        no Med Hx noted  . Other Brother        no Med Hx noted     Social History: Alexandria Hayes lives at home with her husband. She is not working at this time. 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 They are both from [place], but moved out to [place] to take care of her father several years ago.     Review of Systems    Constitutional: Negative for chills, fever, malaise/fatigue and weight loss.  HENT: Negative.  Negative for congestion, ear discharge and ear pain.   Eyes: Negative for pain, discharge and redness.  Respiratory: Negative for cough, sputum production, shortness of breath and wheezing.   Cardiovascular: Negative.  Negative for chest pain and palpitations.  Gastrointestinal: Negative for abdominal pain and heartburn.  Skin: Negative.  Negative for itching and rash.  Neurological: Negative for dizziness and headaches.       Positive for intermittent memory loss.  Endo/Heme/Allergies: Negative for environmental allergies. Does not bruise/bleed easily.  Psychiatric/Behavioral: The patient is nervous/anxious.        Objective:   Blood pressure (!) 158/120, pulse 70, resp. rate 18, height 5' 6.34" (1.685 m), weight 216 lb (98 kg), SpO2 97 %. Body mass index is 34.51 kg/m.   Physical Exam:   Physical Exam Constitutional:      Appearance: She is well-developed.     Comments: Very pleasant female. Cooperative with the exam.   HENT:     Head: Normocephalic and atraumatic.     Right Ear: Tympanic membrane, ear canal and external ear normal. No drainage, swelling or tenderness. Tympanic membrane is not injected, scarred, erythematous, retracted or bulging.     Left Ear: Tympanic membrane, ear canal and external ear normal. No drainage, swelling or tenderness. Tympanic membrane is not injected, scarred, erythematous, retracted or bulging.     Nose: No nasal deformity, septal deviation, mucosal edema or rhinorrhea.     Right Turbinates: Not enlarged or swollen.     Left Turbinates: Not enlarged or swollen.     Right Sinus: No maxillary sinus tenderness or frontal sinus tenderness.     Left Sinus: No maxillary sinus tenderness or frontal sinus tenderness.     Mouth/Throat:     Mouth: Mucous membranes are not pale and not dry.     Pharynx: Uvula midline.  Eyes:     General:  Right  eye: No discharge.        Left eye: No discharge.     Conjunctiva/sclera: Conjunctivae normal.     Right eye: Right conjunctiva is not injected. No chemosis.    Left eye: Left conjunctiva is not injected. No chemosis.    Pupils: Pupils are equal, round, and reactive to light.  Cardiovascular:     Rate and Rhythm: Normal rate and regular rhythm.     Heart sounds: Normal heart sounds.  Pulmonary:     Effort: Pulmonary effort is normal. No tachypnea, accessory muscle usage or respiratory distress.     Breath sounds: Normal breath sounds. No wheezing, rhonchi or rales.     Comments: Moving air well in all lung fields. No increased work of breathing noted.  Chest:     Chest wall: No tenderness.  Abdominal:     Tenderness: There is no abdominal tenderness. There is no guarding or rebound.  Lymphadenopathy:     Head:     Right side of head: No submandibular, tonsillar or occipital adenopathy.     Left side of head: No submandibular, tonsillar or occipital adenopathy.     Cervical: No cervical adenopathy.  Skin:    Coloration: Skin is not pale.     Findings: No abrasion, erythema, petechiae or rash. Rash is not papular, urticarial or vesicular.     Comments: No eczematous or urticarial lesions noted.   Neurological:     Mental Status: She is alert.  Psychiatric:        Behavior: Behavior is cooperative.      Diagnostic studies:   Allergy Studies:    SKIN TESTING (CPT 95018)  SKIN PRICK TESTING ARM #1 SIZE TIME  1. Histamine (1.8mg /mL) 2+ See scanned flowsheet  2. Control (negative - HSA) Negative See scanned flowsheet  3. Triamcinolone (40mg /mL) Negative See scanned flowsheet  4. Methylprednisolone (40mg /mL) Negative See scanned flowsheet  WAIT 15 MINUTES  5. Miralax (1:100 or 1.7mg /mL) Negative See scanned flowsheet  WAIT 15 MINUTES  6. Miralax (1:10 or 17mg /mL) Negative See scanned flowsheet  WAIT 15 MINUTES  7. Miralax (1:1 or 170mg /mL) Negative See scanned flowsheet   WAIT 15 MINUTES   PUNCTURE TEST TOTAL 7    INTRADERMAL TESTING ARM #2 SIZE TIME  1. Control (negative - HSA) Negative See scanned flowsheet  2. Triamcinolone (1:100) Negative See scanned flowsheet  3. Methylprednisolone (1:100) Negative See scanned flowsheet  WAIT 15 MINUTES  4. Triamcinolone (1:10) Negative See scanned flowsheet  5. Methylprednisolone (1:10) Negative See scanned flowsheet  WAIT 15 MINUTES  6. Triamcinolone (1:1) Negative See scanned flowsheet  WAIT 15 MINUTES   INTRADERMAL TEST TOTAL 6    ORAL CHALLENGE (CPT 95076)  DOSE Pre-Vitals (BP/HR/Resp) TIME GIVEN  1. Miralax 170mg /mL suspension 0.75mL  Within normal limits See scanned flow sheet   See scanned flow sheet   WAIT 20 MINUTES  2. Miralax 170mg /mL suspension 19mL  Within normal limits See scanned flow sheet   See scanned flow sheet   WAIT 20 MINUTES  3. Miralax 170mg /mL suspension 10mL  Within normal limits See scanned flow sheet   See scanned flow sheet   WAIT 30-60 MINUTES (To be determined by the provider)   Post-Vitals (BP/HR/Resp)  Within normal limits See scanned flow sheet END TIME  See scanned flow sheet     Total Time     Allergy testing results were read and interpreted by myself, documented by clinical staff.   Because of her  negative testing and negative challenge to Miralax, we decided to proceed with the [place] vaccination. She received 0.1 mL and we watched for her 15 minutes. Then we gave the remaining 0.2 mL and watched her for 30 minutes. She tolerated this without adverse event and was discharged in stable condition.       Salvatore Marvel, MD [facility]

## 2019-09-02 NOTE — Patient Instructions (Addendum)
1. Vaccines and biological substances causing adverse effect - Testing today was negative, including the challenge to the Miralax (PEG). - There is currently no definite skin testing available for the Pfizer and Moderna COVID vaccines and data is limited however, there has been a concern regarding sensitivity to PEG in patients who had anaphylactic reactions to the COVID-19 vaccine. - Skin testing was negative to Miralax (source of PEG 3350), methylprednisolone acetate (source of PEG 3350), and triamcinolone acetonide (source of polysorbate-80). - You tolerated the Pfzier vaccine challenge in the clinic today. - Make an appointment on your way out for your second Pfizer vaccine in three weeks.   2. Multiple antibiotic allergies - Make an appointment for skin testing to penicillin. - We can then do a penicillin challenge to take this one off of your allergy list. - 90% of people with penicillin allergies lose them over the course of 10 years.   3. Return in about 6 weeks (around 10/14/2019) for PENICILLIN TESTING.    Please inform us of any Emergency Department visits, hospitalizations, or changes in symptoms. Call us before going to the ED for breathing or allergy symptoms since we might be able to fit you in for a sick visit. Feel free to contact us anytime with any questions, problems, or concerns.  It was a pleasure to meet you and your family today!  Websites that have reliable patient information: 1. American Academy of Asthma, Allergy, and Immunology: www.aaaai.org 2. Food Allergy Research and Education (FARE): foodallergy.org 3. Mothers of Asthmatics: http://www.asthmacommunitynetwork.org 4. American College of Allergy, Asthma, and Immunology: www.acaai.org   COVID-19 Vaccine Information can be found at: ShippingScam.co.uk For questions related to vaccine distribution or appointments, please email vaccine@[email or web address] or  call [phone].     "Like" [place] on Facebook and Instagram for our latest updates!        Make sure you are registered to vote! If you have moved or changed any of your contact information, you will need to get this updated before voting!  In some cases, you MAY be able to register to vote online: [email or web address]

## 2019-09-04 ENCOUNTER — Other Ambulatory Visit: Payer: Self-pay

## 2019-09-04 NOTE — Telephone Encounter (Signed)
Since her echo is good, no need for the virtual visit next week, please inform the patient that she may delay her follow up to 6-9 month from now. Please forward a copy of recent echo to her eye doctor Dr. Cameron Ali

## 2019-09-04 NOTE — Progress Notes (Signed)
   Covid-19 Vaccination Clinic  Name:  Alexandria Hayes    MRN: [id number] DOB: 08-Apr-1945  09/04/2019  [name] was observed post Covid-19 immunization for 30 minutes based on pre-vaccination screening without incident. She was provided with Vaccine Information Sheet and instruction to access the V-Safe system.   [name] was instructed to call 911 with any severe reactions post vaccine: Marland Kitchen Difficulty breathing  . Swelling of face and throat  . A fast heartbeat  . A bad rash all over body  . Dizziness and weakness   Immunizations Administered    Name Date Dose VIS Date Route   Pfizer COVID-19 Vaccine 09/02/2019  3:44 PM 0.3 mL 02/27/2018 Intramuscular   Manufacturer: [employer], [employer]   Lot: [id number]   [place]: [id number]

## 2019-09-11 ENCOUNTER — Telehealth: Payer: PPO | Admitting: Physician Assistant

## 2019-09-12 DIAGNOSIS — H30041 Focal chorioretinal inflammation, macular or paramacular, right eye: Secondary | ICD-10-CM | POA: Diagnosis not present

## 2019-09-12 DIAGNOSIS — H04212 Epiphora due to excess lacrimation, left lacrimal gland: Secondary | ICD-10-CM | POA: Diagnosis not present

## 2019-09-23 ENCOUNTER — Other Ambulatory Visit: Payer: Self-pay

## 2019-09-23 ENCOUNTER — Ambulatory Visit (INDEPENDENT_AMBULATORY_CARE_PROVIDER_SITE_OTHER): Payer: PPO

## 2019-09-23 DIAGNOSIS — Z23 Encounter for immunization: Secondary | ICD-10-CM | POA: Diagnosis not present

## 2019-09-23 NOTE — Progress Notes (Signed)
   Covid-19 Vaccination Clinic  Name:  Alexandria Hayes    MRN: [id number] DOB: 1945-08-16  09/23/2019  [name] was observed post Covid-19 immunization for 30 minutes based on pre-vaccination screening without incident. She was provided with Vaccine Information Sheet and instruction to access the V-Safe system.   [name] was instructed to call 911 with any severe reactions post vaccine: Marland Kitchen Difficulty breathing  . Swelling of face and throat  . A fast heartbeat  . A bad rash all over body  . Dizziness and weakness   Immunizations Administered    Name Date Dose VIS Date Route   Pfizer COVID-19 Vaccine 09/23/2019 10:17 AM 0.3 mL 02/27/2018 Intramuscular   Manufacturer: [employer], [employer]   Lot: [id number]   [place]: [id number]

## 2019-10-10 ENCOUNTER — Encounter: Payer: PPO | Admitting: Allergy

## 2019-11-19 ENCOUNTER — Encounter: Payer: PPO | Admitting: Allergy

## 2019-12-18 DIAGNOSIS — I10 Essential (primary) hypertension: Secondary | ICD-10-CM | POA: Diagnosis not present

## 2019-12-18 DIAGNOSIS — Z2821 Immunization not carried out because of patient refusal: Secondary | ICD-10-CM | POA: Insufficient documentation

## 2019-12-18 DIAGNOSIS — Z131 Encounter for screening for diabetes mellitus: Secondary | ICD-10-CM | POA: Diagnosis not present

## 2019-12-18 DIAGNOSIS — E7800 Pure hypercholesterolemia, unspecified: Secondary | ICD-10-CM | POA: Diagnosis not present

## 2019-12-18 DIAGNOSIS — Z0000 Encounter for general adult medical examination without abnormal findings: Secondary | ICD-10-CM | POA: Diagnosis not present

## 2019-12-18 DIAGNOSIS — E039 Hypothyroidism, unspecified: Secondary | ICD-10-CM | POA: Diagnosis not present

## 2019-12-18 DIAGNOSIS — F419 Anxiety disorder, unspecified: Secondary | ICD-10-CM | POA: Diagnosis not present

## 2020-02-28 DIAGNOSIS — L570 Actinic keratosis: Secondary | ICD-10-CM | POA: Diagnosis not present

## 2020-02-28 DIAGNOSIS — D492 Neoplasm of unspecified behavior of bone, soft tissue, and skin: Secondary | ICD-10-CM | POA: Diagnosis not present

## 2020-02-28 DIAGNOSIS — L814 Other melanin hyperpigmentation: Secondary | ICD-10-CM | POA: Diagnosis not present

## 2020-02-28 DIAGNOSIS — D229 Melanocytic nevi, unspecified: Secondary | ICD-10-CM | POA: Diagnosis not present

## 2020-02-28 DIAGNOSIS — L219 Seborrheic dermatitis, unspecified: Secondary | ICD-10-CM | POA: Diagnosis not present

## 2020-02-28 DIAGNOSIS — L438 Other lichen planus: Secondary | ICD-10-CM | POA: Diagnosis not present

## 2020-02-28 DIAGNOSIS — L821 Other seborrheic keratosis: Secondary | ICD-10-CM | POA: Diagnosis not present

## 2020-02-28 DIAGNOSIS — L719 Rosacea, unspecified: Secondary | ICD-10-CM | POA: Diagnosis not present

## 2020-03-24 DIAGNOSIS — Z23 Encounter for immunization: Secondary | ICD-10-CM | POA: Diagnosis not present

## 2020-06-18 DIAGNOSIS — Z1231 Encounter for screening mammogram for malignant neoplasm of breast: Secondary | ICD-10-CM | POA: Insufficient documentation

## 2020-06-18 DIAGNOSIS — N959 Unspecified menopausal and perimenopausal disorder: Secondary | ICD-10-CM | POA: Diagnosis not present

## 2020-06-18 DIAGNOSIS — I69959 Hemiplegia and hemiparesis following unspecified cerebrovascular disease affecting unspecified side: Secondary | ICD-10-CM | POA: Diagnosis not present

## 2020-06-18 DIAGNOSIS — F419 Anxiety disorder, unspecified: Secondary | ICD-10-CM | POA: Diagnosis not present

## 2020-06-18 DIAGNOSIS — I10 Essential (primary) hypertension: Secondary | ICD-10-CM | POA: Diagnosis not present

## 2020-06-18 DIAGNOSIS — G43109 Migraine with aura, not intractable, without status migrainosus: Secondary | ICD-10-CM | POA: Diagnosis not present

## 2020-06-18 DIAGNOSIS — R7301 Impaired fasting glucose: Secondary | ICD-10-CM | POA: Diagnosis not present

## 2020-06-18 DIAGNOSIS — E039 Hypothyroidism, unspecified: Secondary | ICD-10-CM | POA: Diagnosis not present

## 2020-06-18 DIAGNOSIS — E7800 Pure hypercholesterolemia, unspecified: Secondary | ICD-10-CM | POA: Diagnosis not present

## 2020-07-17 DIAGNOSIS — N959 Unspecified menopausal and perimenopausal disorder: Secondary | ICD-10-CM | POA: Diagnosis not present

## 2020-07-17 DIAGNOSIS — M810 Age-related osteoporosis without current pathological fracture: Secondary | ICD-10-CM | POA: Diagnosis not present

## 2020-07-17 DIAGNOSIS — Z1231 Encounter for screening mammogram for malignant neoplasm of breast: Secondary | ICD-10-CM | POA: Diagnosis not present

## 2020-07-30 ENCOUNTER — Other Ambulatory Visit: Payer: Self-pay | Admitting: Internal Medicine

## 2020-08-06 DIAGNOSIS — M792 Neuralgia and neuritis, unspecified: Secondary | ICD-10-CM | POA: Diagnosis not present

## 2020-08-06 DIAGNOSIS — M2021 Hallux rigidus, right foot: Secondary | ICD-10-CM | POA: Diagnosis not present

## 2020-08-06 DIAGNOSIS — M21962 Unspecified acquired deformity of left lower leg: Secondary | ICD-10-CM | POA: Diagnosis not present

## 2020-08-06 DIAGNOSIS — M2022 Hallux rigidus, left foot: Secondary | ICD-10-CM | POA: Diagnosis not present

## 2020-08-20 DIAGNOSIS — M2022 Hallux rigidus, left foot: Secondary | ICD-10-CM | POA: Diagnosis not present

## 2020-08-20 DIAGNOSIS — M79675 Pain in left toe(s): Secondary | ICD-10-CM | POA: Diagnosis not present

## 2020-08-20 DIAGNOSIS — M2042 Other hammer toe(s) (acquired), left foot: Secondary | ICD-10-CM | POA: Diagnosis not present

## 2020-08-21 DIAGNOSIS — M2022 Hallux rigidus, left foot: Secondary | ICD-10-CM | POA: Insufficient documentation

## 2020-08-21 DIAGNOSIS — M2042 Other hammer toe(s) (acquired), left foot: Secondary | ICD-10-CM | POA: Insufficient documentation

## 2020-08-28 DIAGNOSIS — M545 Low back pain, unspecified: Secondary | ICD-10-CM | POA: Insufficient documentation

## 2020-08-28 DIAGNOSIS — M5136 Other intervertebral disc degeneration, lumbar region: Secondary | ICD-10-CM | POA: Diagnosis not present

## 2020-08-28 DIAGNOSIS — Z981 Arthrodesis status: Secondary | ICD-10-CM | POA: Diagnosis not present

## 2020-08-28 DIAGNOSIS — M533 Sacrococcygeal disorders, not elsewhere classified: Secondary | ICD-10-CM | POA: Diagnosis not present

## 2020-09-03 DIAGNOSIS — L03211 Cellulitis of face: Secondary | ICD-10-CM | POA: Diagnosis not present

## 2020-09-09 DIAGNOSIS — H00014 Hordeolum externum left upper eyelid: Secondary | ICD-10-CM | POA: Diagnosis not present

## 2020-09-10 DIAGNOSIS — M2022 Hallux rigidus, left foot: Secondary | ICD-10-CM | POA: Diagnosis not present

## 2020-09-10 DIAGNOSIS — M2042 Other hammer toe(s) (acquired), left foot: Secondary | ICD-10-CM | POA: Diagnosis not present

## 2020-09-22 DIAGNOSIS — H0014 Chalazion left upper eyelid: Secondary | ICD-10-CM | POA: Diagnosis not present

## 2020-09-29 DIAGNOSIS — Z23 Encounter for immunization: Secondary | ICD-10-CM | POA: Diagnosis not present

## 2020-10-14 DIAGNOSIS — M2042 Other hammer toe(s) (acquired), left foot: Secondary | ICD-10-CM | POA: Diagnosis not present

## 2020-10-14 DIAGNOSIS — M2022 Hallux rigidus, left foot: Secondary | ICD-10-CM | POA: Diagnosis not present

## 2020-10-20 DIAGNOSIS — Z01818 Encounter for other preprocedural examination: Secondary | ICD-10-CM | POA: Diagnosis not present

## 2020-10-20 DIAGNOSIS — I10 Essential (primary) hypertension: Secondary | ICD-10-CM | POA: Diagnosis not present

## 2020-10-20 DIAGNOSIS — M2042 Other hammer toe(s) (acquired), left foot: Secondary | ICD-10-CM | POA: Diagnosis not present

## 2020-10-20 DIAGNOSIS — I69959 Hemiplegia and hemiparesis following unspecified cerebrovascular disease affecting unspecified side: Secondary | ICD-10-CM | POA: Diagnosis not present

## 2020-10-20 DIAGNOSIS — R011 Cardiac murmur, unspecified: Secondary | ICD-10-CM | POA: Diagnosis not present

## 2020-10-20 DIAGNOSIS — M2022 Hallux rigidus, left foot: Secondary | ICD-10-CM | POA: Diagnosis not present

## 2020-10-20 DIAGNOSIS — I6523 Occlusion and stenosis of bilateral carotid arteries: Secondary | ICD-10-CM | POA: Diagnosis not present

## 2020-11-09 DIAGNOSIS — Z789 Other specified health status: Secondary | ICD-10-CM | POA: Diagnosis not present

## 2020-11-09 DIAGNOSIS — G459 Transient cerebral ischemic attack, unspecified: Secondary | ICD-10-CM | POA: Diagnosis not present

## 2020-11-13 DIAGNOSIS — I639 Cerebral infarction, unspecified: Secondary | ICD-10-CM | POA: Diagnosis not present

## 2020-11-13 DIAGNOSIS — I69959 Hemiplegia and hemiparesis following unspecified cerebrovascular disease affecting unspecified side: Secondary | ICD-10-CM | POA: Diagnosis not present

## 2020-11-13 DIAGNOSIS — I10 Essential (primary) hypertension: Secondary | ICD-10-CM | POA: Diagnosis not present

## 2020-11-13 DIAGNOSIS — I6523 Occlusion and stenosis of bilateral carotid arteries: Secondary | ICD-10-CM | POA: Diagnosis not present

## 2020-11-19 DIAGNOSIS — R42 Dizziness and giddiness: Secondary | ICD-10-CM | POA: Diagnosis not present

## 2020-11-19 DIAGNOSIS — R4781 Slurred speech: Secondary | ICD-10-CM | POA: Diagnosis not present

## 2020-11-19 DIAGNOSIS — I639 Cerebral infarction, unspecified: Secondary | ICD-10-CM | POA: Diagnosis not present

## 2020-11-20 DIAGNOSIS — Z8673 Personal history of transient ischemic attack (TIA), and cerebral infarction without residual deficits: Secondary | ICD-10-CM | POA: Insufficient documentation

## 2020-12-02 NOTE — Progress Notes (Addendum)
Office Visit    Patient Name: Alexandria Hayes Date of Encounter: 12/03/2020  PCP:  Algis Greenhouse, MD   [place]  Cardiologist:  Pixie Casino, MD  Advanced Practice Provider:  No care team member to display Electrophysiologist:  None    Chief Complaint    Alexandria Hayes is a 76 y.o. female with a hx of chronic fatigue, fibromyalgia, hypertension with orthostasis, labile BP, migraine headache and history of stroke presents today for hypertension follow-up.  Past Medical History    Past Medical History:  Diagnosis Date   Chronic fatigue disorder    Essential hypertension    History of closed head injury 05/2012   Skull fracture and left forehead; related to trauma   Hypothyroid    On replacement therapy   Migraines    Stroke [facility])    Past Surgical History:  Procedure Laterality Date   ABDOMINAL HYSTERECTOMY  12/2010   BACK SURGERY  11/2007   L4-L5 fusion   CATARACT EXTRACTION, BILATERAL  09/2009   REPLACEMENT TOTAL KNEE BILATERAL Bilateral 2011   February and October    Allergies  Allergies  Allergen Reactions   Amlodipine Rash    Blisters on gums   Clonidine Other (See Comments)    GI Upset (intolerance) Dry mouth Dizziness   Nitrofuran Derivatives Anaphylaxis, Swelling and Rash        Triamcinolone Acetonide    Ciprofloxacin Nausea Only   Cortisone Anxiety and Hypertension   Erythromycin Rash   Levaquin [Levofloxacin In D5w] Anxiety and Hypertension   Penicillins Rash    Has patient had a PCN reaction causing immediate rash, facial/tongue/throat swelling, SOB or lightheadedness with hypotension: Yes Has patient had a PCN reaction causing severe rash involving mucus membranes or skin necrosis: No Has patient had a PCN reaction that required hospitalization No Has patient had a PCN reaction occurring within the last 10 years: No If all of the above answers are "NO", then may proceed with Cephalosporin use.     Prochlorperazine Edisylate Anxiety and Hypertension    hypertension   Tetracyclines & Related Rash    History of Present Illness    Alexandria Hayes is a 75 y.o. female with a hx of hx of chronic fatigue, fibromyalgia, hypertension with orthostasis, labile BP, migraine headache and history of stroke presents today for hypertension follow-up last seen by Almyra Deforest, PA-C virtually on 06/25/2019.  Both her husband and her are [employer].  Her husband is also a patient of Dr. Debara Pickett.  Patient was previously seen by Dr. Ellyn Hack however switched to Dr. Debara Pickett later.  She was seen in December 2020 by Dr. Debara Pickett for labile hypertension.  She has multiple medication intolerances including amlodipine, clonidine and antibiotics.He was hesitant at that time to prescribe any diuretics which may worsen her orthostatic symptoms.  She previously wore a heart monitor in October 2018 which showed sinus bradycardia with heart rate of 59 bpm, occasional PVCs but otherwise no significant findings.  When she was last seen in June 2021 her blood pressure was fairly well controlled on carvedilol and irbesartan.  Average blood pressure has been around 140/90.  Her heart rate was between 57 and 63 bpm.  Recently, she was evaluated by Dr. Dennie Fetters of [facility].  She was diagnosed with macular focal retinitis.  Her eye doctor recommended carotid Doppler and echocardiogram.  She has since been seen by her PCP Dr. Garlon Hatchet who ordered carotid Doppler.  Carotid duplex was fairly normal with minimal plaque in her carotid artery, no significant stenosis was noted, antegrade vertebral flow.  We proceeded with echocardiogram.  Echocardiogram showed normal pumping function of the heart, mild mitral valve regurgitation.  She had MRI 11/2020 showing old stroke on the left side and no acute findings. This was ordered due to patient report of a 5-10 second episode of gargled speech and leg heaviness. She was recommended to see  neurology and agreed to consider. She was started on Zetia due to statin intolerance. (Labs 06/18/20 LDL 184, total cholesterol 274, triglycerides 112, HDL 68)  Today, she states that she has been having a difficult time controlling her blood pressure at home.  She states that while sitting her blood pressure is high but as soon as she stands and starts to move around she states that it drops significantly and she feels lightheaded and dizzy.  Her legs also feel heavy and she does have some nausea.  Recently her primary care made some medication changes.  She has no longer on hydralazine and he changed her Avapro from 300 mg daily to 150 mg twice daily.  She was taking her Avapro at nighttime.  She also now takes her Norvasc only as needed if her systolic blood pressure is greater than 180.  She was told to decrease her Coreg to 6.25 mg twice a day but has not made this change yet.  I reviewed her blood pressure log today.  It does seem like her blood pressure drops 092 systolic with standing and she states that when her systolic blood pressure drops below 120 she gets lightheaded and dizzy.  She does have a few very rare high blood pressures that are into the 330Q to 762U systolic.  However, most of her blood pressures are in a good range.  We also reviewed her echocardiogram today which showed sinus bradycardia, rate 55 bpm.  She does endorse some fatigue but this could be multifactorial.  She does state that when she was wearing her compression stockings she felt like her blood pressure was better controlled but she stopped wearing them because her toes become numb.  We have recommended she visit elastic therapy for a fitting and try compression socks or stockings without the toes.  We also emphasized eating every couple of hours versus going long stretches without food or water.  She states that she is changed from caffeinated coffee to decaf and this is seem to help as well.  We encouraged hydration as well  since dehydration can also make you feel dizzy or lightheaded and drop your blood pressure.  We also recommended getting some updated labs such as BMP and CBC because she has not had those drawn in a year.  She also has not had an echocardiogram in over a year and it would be worth checking her heart function and heart valves and she does have a history of mild MR.  The patient feels that her incident earlier this month of 5 to 10 seconds of garbled speech and leg heaviness was due to hypotension and not due to a cerebrovascular event.   Reports no shortness of breath nor dyspnea on exertion. Reports no chest pain, pressure, or tightness. No orthopnea, PND. Reports no palpitations.     EKGs/Labs/Other Studies Reviewed:   The following studies were reviewed today:  Echocardiogram 08/2019 IMPRESSIONS     1. Left ventricular ejection fraction, by estimation, is 60 to 65%. The  left ventricle has  normal function. The left ventricle has no regional  wall motion abnormalities. Left ventricular diastolic parameters are  consistent with Grade I diastolic  dysfunction (impaired relaxation). The average left ventricular global  longitudinal strain is -21.2 %. The global longitudinal strain is normal.   2. Right ventricular systolic function is normal. The right ventricular  size is normal.   3. The mitral valve is abnormal. Mild mitral valve regurgitation.   4. The aortic valve is tricuspid. Aortic valve regurgitation is not  visualized. Mild to moderate aortic valve sclerosis/calcification is  present, without any evidence of aortic stenosis.   5. The inferior vena cava is normal in size with greater than 50%  respiratory variability, suggesting right atrial pressure of 3 mmHg.   FINDINGS   Left Ventricle: Left ventricular ejection fraction, by estimation, is 60  to 65%. The left ventricle has normal function. The left ventricle has no  regional wall motion abnormalities. The average left  ventricular global  longitudinal strain is -21.2 %.  The global longitudinal strain is normal. The left ventricular internal  cavity size was normal in size. There is no left ventricular hypertrophy.  Left ventricular diastolic parameters are consistent with Grade I  diastolic dysfunction (impaired  relaxation). Indeterminate filling pressures.   Right Ventricle: The right ventricular size is normal. No increase in  right ventricular wall thickness. Right ventricular systolic function is  normal.   Left Atrium: Left atrial size was normal in size.   Right Atrium: Right atrial size was normal in size.   Pericardium: There is no evidence of pericardial effusion.   Mitral Valve: The mitral valve is abnormal. There is mild thickening of  the mitral valve leaflet(s). There is mild calcification of the mitral  valve leaflet(s). Mild mitral valve regurgitation.   Tricuspid Valve: The tricuspid valve is grossly normal. Tricuspid valve  regurgitation is trivial.   Aortic Valve: The aortic valve is tricuspid. Aortic valve regurgitation is  not visualized. Mild to moderate aortic valve sclerosis/calcification is  present, without any evidence of aortic stenosis.   Pulmonic Valve: The pulmonic valve was normal in structure. Pulmonic valve  regurgitation is not visualized.   Aorta: The aortic root and ascending aorta are structurally normal, with  no evidence of dilitation.   Venous: The inferior vena cava is normal in size with greater than 50%  respiratory variability, suggesting right atrial pressure of 3 mmHg.   IAS/Shunts: No atrial level shunt detected by color flow Doppler.    Event monitor 11/02/2016 30-day event monitor: 4 stable events (3 manual) Baseline rhythm sinus rhythm - Average heart rate 64 bpm Minimum heart rate 42 bpm (sinus bradycardia), maximum heart rate 103 bpm (sinus tachycardia). . Rare PVCs but no significant arrhythmias noted. Symptoms of rapid/fast  heartbeat or skipped heartbeat noted with sinus rhythm as well as sinus rhythm with PVCs.   Summary: The patient's monitoring period was 11/02/2016 - 12/01/2016. Baseline sample showed Sinus Bradycardia w/Artifact with a heart rate of 59.7 bpm.  There were 0 critical, 0 serious, and 4 stable events that occurred. The report analysis of the critical, serious, stable and manually triggered events are listed below. Manually Detected Events: 1 Stable: Sinus Rhythm  Rapid or Fast Heartbeat 1 Stable: Sinus Rhythm w/PVCs (1 in 1/Min)/Artifact  Rapid or Fast Heartbeat; Flutter or Skipped Beats 1 Stable: Sinus Rhythm w/PVCs (2 in 1/Min)/Artifact  Flutter or Skipped Beats Automatically Detected Events: 1 Stable: Sinus Bradycardia w/Artifact End of summarized findings.Event monitor 11/02/2016 30-day  event monitor: 4 stable events (3 manual) Baseline rhythm sinus rhythm - Average heart rate 64 bpm Minimum heart rate 42 bpm (sinus bradycardia), maximum heart rate 103 bpm (sinus tachycardia). . Rare PVCs but no significant arrhythmias noted. Symptoms of rapid/fast heartbeat or skipped heartbeat noted with sinus rhythm as well as sinus rhythm with PVCs.   Summary: The patient's monitoring period was 11/02/2016 - 12/01/2016. Baseline sample showed Sinus Bradycardia w/Artifact with a heart rate of 59.7 bpm.  There were 0 critical, 0 serious, and 4 stable events that occurred. The report analysis of the critical, serious, stable and manually triggered events are listed below. Manually Detected Events: 1 Stable: Sinus Rhythm  Rapid or Fast Heartbeat 1 Stable: Sinus Rhythm w/PVCs (1 in 1/Min)/Artifact  Rapid or Fast Heartbeat; Flutter or Skipped Beats 1 Stable: Sinus Rhythm w/PVCs (2 in 1/Min)/Artifact  Flutter or Skipped Beats Automatically Detected Events: 1 Stable: Sinus Bradycardia w/Artifact End of summarized findings.   EKG:  EKG is  ordered today.  The ekg ordered today demonstrates sinus  bradycardia, rate 55 bpm  Recent Labs: No results found for requested labs within last 8760 hours.  Recent Lipid Panel No results found for: CHOL, TRIG, HDL, CHOLHDL, VLDL, LDLCALC, LDLDIRECT   Home Medications   Current Meds  Medication Sig   ALPRAZolam (XANAX) 0.5 MG tablet Take 0.5 mg by mouth daily as needed for anxiety.   amLODipine (NORVASC) 5 MG tablet TAKE 1 TO 2 TABLETS BY MOUTH ONCE A WEEK FOR BLOOD PRESSURE OVER 170/90 (Patient taking differently: Take 5 mg by mouth as needed. TAKE 1 TO 2 TABLETS BY MOUTH ONCE A WEEK FOR BLOOD PRESSURE OVER 170/90)   Ascorbic Acid (VITAMIN C PO) Take 1 tablet by mouth daily.   aspirin EC 81 MG tablet Take 81 mg by mouth daily.    calcium carbonate (TUMS - DOSED IN MG ELEMENTAL CALCIUM) 500 MG chewable tablet Chew 1 tablet by mouth daily as needed for indigestion or heartburn.    carvedilol (COREG) 12.5 MG tablet Take 12.5 mg by mouth 2 (two) times daily with a meal.   Cholecalciferol 125 MCG (5000 UT) capsule Take 5,000 Units by mouth daily.   co-enzyme Q-10 50 MG capsule Take 100 mg by mouth daily.   gabapentin (NEURONTIN) 300 MG capsule Take 300 mg by mouth at bedtime.   irbesartan (AVAPRO) 300 MG tablet Take 300 mg by mouth at bedtime.   Levothyroxine Sodium 88 MCG CAPS Take 88 mcg by mouth every morning.   MAGNESIUM PO Take 150 mg by mouth daily. Powder Form   Multiple Vitamin (MULTIVITAMIN WITH MINERALS) TABS tablet Take 1 tablet by mouth daily.   naproxen sodium (ANAPROX) 220 MG tablet Take 440 mg by mouth daily as needed (pain).   Probiotic Product (PROBIOTIC PO) Take 1 tablet by mouth daily.     Review of Systems      All other systems reviewed and are otherwise negative except as noted above.  Physical Exam    VS:  BP (!) 168/110 Comment: after standing, talking, for 3 mins  Pulse (!) 55   Ht 5\' 6"  (1.676 m)   Wt 223 lb (101.2 kg)   BMI 35.99 kg/m  , BMI Body mass index is 35.99 kg/m.  Wt Readings from Last 3 Encounters:   12/03/20 223 lb (101.2 kg)  09/02/19 216 lb (98 kg)  12/20/17 220 lb 9.6 oz (100.1 kg)     GEN: Well nourished, well developed, in no acute  distress. HEENT: normal. Neck: Supple, no JVD, carotid bruits, or masses. Cardiac: RRR, 2/6 systolic murmur heard best on the right sternal border, rubs, or gallops. No clubbing, cyanosis, edema.  Radials/PT 2+ and equal bilaterally.  Respiratory:  Respirations regular and unlabored, clear to auscultation bilaterally. GI: Soft, nontender, nondistended. MS: No deformity or atrophy. Skin: Warm and dry, no rash. Neuro:  Strength and sensation are intact. Psych: Normal affect.  Assessment & Plan    Hypertension -Elevated today in the clinic.  170/106 sitting, 168/110 standing -Most of the patient's home blood pressures are between 320-233 systolic, the patient states that she becomes dizzy if her blood pressure drops below 435 systolic -Continue heart healthy diet, eating three meals a day -Continue to monitor blood pressure at home -Current medication includes Norvasc 5 mg daily (Taking PRN for BP > 180) Coreg we will decrease the dose to 6.25 mg twice daily, Avapro 150mg  BID -Today we have ordered a repeat echocardiogram to evaluate her heart function and mild mitral regurgitation. -We have also ordered a BMP and a CBC to check for abnormal electrolytes and anemia which could be contributing to her symptoms. -Handout provided on hydration -Elastic therapy handout provided  Macular focal retinitis -Patient was seen by her eye doctor who recommended carotid Doppler and echocardiogram -Carotid Doppler was ordered by PCP and was negative for significant disease -Echocardiogram showed normal LVEF and mild mitral regurgitation  Systolic grade 2/6 Heart murmur -Echocardiogram reviewed. Mild MR -Repeat Echo ordered today  4. Hyperlipidemia -Labs 06/18/20 LDL 184, total cholesterol 274, triglycerides 112, HDL 68 - Zetia just ordered by PCP, repeat  labs ordered though PCP as well  -Continue dietary recommendations  Disposition: Follow up 2-3 months with Pixie Casino, MD or APP.  Signed, Elgie Collard, PA-C 12/03/2020, 11:17 AM [facility]

## 2020-12-03 ENCOUNTER — Ambulatory Visit (HOSPITAL_BASED_OUTPATIENT_CLINIC_OR_DEPARTMENT_OTHER): Payer: PPO | Admitting: Physician Assistant

## 2020-12-03 ENCOUNTER — Encounter (HOSPITAL_BASED_OUTPATIENT_CLINIC_OR_DEPARTMENT_OTHER): Payer: Self-pay | Admitting: Physician Assistant

## 2020-12-03 ENCOUNTER — Other Ambulatory Visit: Payer: Self-pay

## 2020-12-03 VITALS — BP 168/110 | HR 55 | Ht 66.0 in | Wt 223.0 lb

## 2020-12-03 DIAGNOSIS — R011 Cardiac murmur, unspecified: Secondary | ICD-10-CM

## 2020-12-03 DIAGNOSIS — I10 Essential (primary) hypertension: Secondary | ICD-10-CM | POA: Diagnosis not present

## 2020-12-03 DIAGNOSIS — E782 Mixed hyperlipidemia: Secondary | ICD-10-CM

## 2020-12-03 DIAGNOSIS — I340 Nonrheumatic mitral (valve) insufficiency: Secondary | ICD-10-CM | POA: Diagnosis not present

## 2020-12-03 DIAGNOSIS — H30049 Focal chorioretinal inflammation, macular or paramacular, unspecified eye: Secondary | ICD-10-CM | POA: Diagnosis not present

## 2020-12-03 LAB — BASIC METABOLIC PANEL
BUN/Creatinine Ratio: 17 (ref 12–28)
BUN: 13 mg/dL (ref 8–27)
CO2: 26 mmol/L (ref 20–29)
Calcium: 10.2 mg/dL (ref 8.7–10.3)
Chloride: 99 mmol/L (ref 96–106)
Creatinine, Ser: 0.78 mg/dL (ref 0.57–1.00)
Glucose: 93 mg/dL (ref 70–99)
Potassium: 4.8 mmol/L (ref 3.5–5.2)
Sodium: 137 mmol/L (ref 134–144)
eGFR: 80 mL/min/{1.73_m2} (ref >59)

## 2020-12-03 LAB — CBC
Hematocrit: 43.4 % (ref 34.0–46.6)
Hemoglobin: 14.4 g/dL (ref 11.1–15.9)
MCH: 30.4 pg (ref 26.6–33.0)
MCHC: 33.2 g/dL (ref 31.5–35.7)
MCV: 92 fL (ref 79–97)
Platelets: 229 10*3/uL (ref 150–450)
RBC: 4.74 x10E6/uL (ref 3.77–5.28)
RDW: 13.1 % (ref 11.7–15.4)
WBC: 5.6 10*3/uL (ref 3.4–10.8)

## 2020-12-03 NOTE — Patient Instructions (Addendum)
Medication Instructions:  Your physician has recommended you make the following change in your medication:   Reduce Coreg to 12.5mg  ( half tablet) daily   *If you need a refill on your cardiac medications before your next appointment, please call your pharmacy*   Lab Work: Your physician recommends that you return for lab work today (3rd floor)-CBC, BMP  If you have labs (blood work) drawn today and your tests are completely normal, you will receive your results only by: MyChart Message (if you have MyChart) OR A paper copy in the mail If you have any lab test that is abnormal or we need to change your treatment, we will call you to review the results.   Testing/Procedures: Your physician has requested that you have an echocardiogram. Echocardiography is a painless test that uses sound waves to create images of your heart. It provides your doctor with information about the size and shape of your heart and how well your heart's chambers and valves are working. This procedure takes approximately one hour. There are no restrictions for this procedure. [place], you and your health needs are our priority.  As part of our continuing mission to provide you with exceptional heart care, we have created designated Provider Care Teams.  These Care Teams include your primary Cardiologist (physician) and Advanced Practice Providers (APPs -  Physician Assistants and Nurse Practitioners) who all work together to provide you with the care you need, when you need it.  We recommend signing up for the patient portal called "MyChart".  Sign up information is provided on this After Visit Summary.  MyChart is used to connect with patients for Virtual Visits (Telemedicine).  Patients are able to view lab/test results, encounter notes, upcoming appointments, etc.  Non-urgent messages can be sent to your provider as well.   To learn more about what you can  do with MyChart, go to [email or web address].    Your next appointment:   2-3 month(s)  The format for your next appointment:   In Person  Provider:   Pixie Casino, MD  or APP    Other Instructions Please take in 64oz of water daily and make sure to be eating 3 meals per day!

## 2020-12-15 ENCOUNTER — Encounter (HOSPITAL_BASED_OUTPATIENT_CLINIC_OR_DEPARTMENT_OTHER): Payer: Self-pay

## 2020-12-15 NOTE — Telephone Encounter (Signed)
Updated blood pressure readings

## 2020-12-16 ENCOUNTER — Other Ambulatory Visit: Payer: Self-pay

## 2020-12-16 ENCOUNTER — Ambulatory Visit (INDEPENDENT_AMBULATORY_CARE_PROVIDER_SITE_OTHER): Payer: PPO

## 2020-12-16 DIAGNOSIS — I340 Nonrheumatic mitral (valve) insufficiency: Secondary | ICD-10-CM | POA: Diagnosis not present

## 2020-12-16 LAB — ECHOCARDIOGRAM COMPLETE
AR max vel: 2.03 cm2
AV Area VTI: 1.97 cm2
AV Area mean vel: 1.88 cm2
AV Mean grad: 5 mmHg
AV Peak grad: 11.4 mmHg
Ao pk vel: 1.69 m/s
Area-P 1/2: 5.5 cm2
Calc EF: 62 %
S' Lateral: 3.21 cm
Single Plane A2C EF: 66.1 %
Single Plane A4C EF: 57 %

## 2020-12-18 ENCOUNTER — Encounter (HOSPITAL_BASED_OUTPATIENT_CLINIC_OR_DEPARTMENT_OTHER): Payer: Self-pay

## 2021-01-03 HISTORY — PX: TEAR DUCT PROBING: SHX793

## 2021-01-11 NOTE — Progress Notes (Signed)
Office Visit    Patient Name: Alexandria Hayes Date of Encounter: 01/12/2021  PCP:  Algis Greenhouse, MD   [place]  Cardiologist:  Pixie Casino, MD  Advanced Practice Provider:  No care team member to display Electrophysiologist:  None      Chief Complaint    Alexandria Hayes is a 76 y.o. female with a hx of chronic fatigue, fibromyalgia, hypertension with orthostasis, labile blood pressure, migraine, seizure presents today for follow up after echocardiogram.   Past Medical History    Past Medical History:  Diagnosis Date   Chronic fatigue disorder    Essential hypertension    History of closed head injury 05/2012   Skull fracture and left forehead; related to trauma   Hypothyroid    On replacement therapy   Migraines    Stroke [facility])    Past Surgical History:  Procedure Laterality Date   ABDOMINAL HYSTERECTOMY  12/2010   BACK SURGERY  11/2007   L4-L5 fusion   CATARACT EXTRACTION, BILATERAL  09/2009   REPLACEMENT TOTAL KNEE BILATERAL Bilateral 2011   February and October    Allergies  Allergies  Allergen Reactions   Amlodipine Rash    Blisters on gums   Clonidine Other (See Comments)    GI Upset (intolerance) Dry mouth Dizziness   Nitrofuran Derivatives Anaphylaxis, Swelling and Rash        Triamcinolone Acetonide Anxiety    hypertension Hypertension - Patient states that this led to a stroke    Atorvastatin Other (See Comments)    Back pain   Simvastatin Other (See Comments)    memory   Ciprofloxacin Nausea Only   Cortisone Anxiety and Hypertension   Erythromycin Rash   Levaquin [Levofloxacin In D5w] Anxiety and Hypertension   Penicillins Rash    Has patient had a PCN reaction causing immediate rash, facial/tongue/throat swelling, SOB or lightheadedness with hypotension: Yes Has patient had a PCN reaction causing severe rash involving mucus membranes or skin necrosis: No Has patient had a PCN reaction that required  hospitalization No Has patient had a PCN reaction occurring within the last 10 years: No If all of the above answers are "NO", then may proceed with Cephalosporin use.    Prochlorperazine Edisylate Anxiety and Hypertension    hypertension   Tetracyclines & Related Rash   Wound Dressing Adhesive Itching and Rash    History of Present Illness    Alexandria Hayes is a 76 y.o. female with a hx of chronic fatigue, fibromyalgia, hypertension with orthostasis, labile blood pressure, migraine, seizure  last seen 12/03/20 by Nicholes Rough, PA.  Miss Kercheval and her husband are both Jehovah's Witness' and follow with Dr. Debara Pickett.  She has multiple medication intolerances including amlodipine, clonidine, antibiotics.  Previous cardiac monitoring 2018 with sinus bradycardia with rate 59 bpm, occasional PVC, otherwise no significant findings.  She was seen June 2021 and BP overall well controlled on carvedilol and irbesartan. She was seen by [facility] and diagnosed with macular focal retinitis.  She was recommended for carotid Doppler and echocardiogram by her eye doctor.  Carotid duplex showed minimal plaque in carotid arteries with no significant stenosis.  Echocardiogram 12/16/20 with normal LVEF, mild MR.  She was seen 12/03/2020 by Nicholes Rough, PA. Noted difficulties with elevated BP as well as orthostasis. PCp had made the following medication changes: Hydralazine discontinued, Avapro transitioned to 150mg  BID, Amlodipine PRN, Coreg reduced to 6.25mg  BID. BP in clinic 170/106  sitting and 168/110 standing. Echocardiogram was ordered. Her reported home BPs were in the 120s and no medication changes were made.   Echo 12/16/20 LVEF 63%, no RWMA, gr1DD, RV normal size and function, normal PASP, moderate MR, trivial AI, mild dilation ascending aorta 53mm.   She presents today for follow up.  We reviewed her echocardiogram in detail. She has reduced her Carvedilol to half tablet as directed with improvement in  symptoms. Her BP at home is most often systolic [id number]. Occasional reading of SBP 116 associated with lightheadedness. Reports no shortness of breath nor dyspnea on exertion. Reports no chest pain, pressure, or tightness. No edema, orthopnea, PND. Reports no palpitations.     EKGs/Labs/Other Studies Reviewed:   The following studies were reviewed today:  Echo 12/16/20  1. Left ventricular ejection fraction by 3D volume is 63 %. The left  ventricle has normal function. The left ventricle has no regional wall  motion abnormalities. Left ventricular diastolic parameters are consistent  with Grade I diastolic dysfunction  (impaired relaxation). Elevated left atrial pressure. The average left  ventricular global longitudinal strain is -19.2 %. The global longitudinal  strain is normal.   2. Right ventricular systolic function is normal. The right ventricular  size is normal. There is normal pulmonary artery systolic pressure. The  estimated right ventricular systolic pressure is 71.0 mmHg.   3. The mitral valve is normal in structure. Moderate mitral valve  regurgitation. No evidence of mitral stenosis.   4. The aortic valve is tricuspid. There is moderate calcification of the  aortic valve. There is moderate thickening of the aortic valve. Aortic  valve regurgitation is trivial. Aortic valve sclerosis/calcification is  present, without any evidence of  aortic stenosis. Aortic valve Vmax measures 1.69 m/s.   5. Aortic dilatation noted. There is mild dilatation of the ascending  aorta, measuring 40 mm.   6. The inferior vena cava is normal in size with greater than 50%  respiratory variability, suggesting right atrial pressure of 3 mmHg.   Comparison(s): EF 60%, GLS -21.2%, mild aortic sclerosis no AS.   Echocardiogram 08/2019 IMPRESSIONS     1. Left ventricular ejection fraction, by estimation, is 60 to 65%. The  left ventricle has normal function. The left ventricle has no regional   wall motion abnormalities. Left ventricular diastolic parameters are  consistent with Grade I diastolic  dysfunction (impaired relaxation). The average left ventricular global  longitudinal strain is -21.2 %. The global longitudinal strain is normal.   2. Right ventricular systolic function is normal. The right ventricular  size is normal.   3. The mitral valve is abnormal. Mild mitral valve regurgitation.   4. The aortic valve is tricuspid. Aortic valve regurgitation is not  visualized. Mild to moderate aortic valve sclerosis/calcification is  present, without any evidence of aortic stenosis.   5. The inferior vena cava is normal in size with greater than 50%  respiratory variability, suggesting right atrial pressure of 3 mmHg.   FINDINGS   Left Ventricle: Left ventricular ejection fraction, by estimation, is 60  to 65%. The left ventricle has normal function. The left ventricle has no  regional wall motion abnormalities. The average left ventricular global  longitudinal strain is -21.2 %.  The global longitudinal strain is normal. The left ventricular internal  cavity size was normal in size. There is no left ventricular hypertrophy.  Left ventricular diastolic parameters are consistent with Grade I  diastolic dysfunction (impaired  relaxation). Indeterminate filling pressures.  Right Ventricle: The right ventricular size is normal. No increase in  right ventricular wall thickness. Right ventricular systolic function is  normal.   Left Atrium: Left atrial size was normal in size.   Right Atrium: Right atrial size was normal in size.   Pericardium: There is no evidence of pericardial effusion.   Mitral Valve: The mitral valve is abnormal. There is mild thickening of  the mitral valve leaflet(s). There is mild calcification of the mitral  valve leaflet(s). Mild mitral valve regurgitation.   Tricuspid Valve: The tricuspid valve is grossly normal. Tricuspid valve   regurgitation is trivial.   Aortic Valve: The aortic valve is tricuspid. Aortic valve regurgitation is  not visualized. Mild to moderate aortic valve sclerosis/calcification is  present, without any evidence of aortic stenosis.   Pulmonic Valve: The pulmonic valve was normal in structure. Pulmonic valve  regurgitation is not visualized.   Aorta: The aortic root and ascending aorta are structurally normal, with  no evidence of dilitation.   Venous: The inferior vena cava is normal in size with greater than 50%  respiratory variability, suggesting right atrial pressure of 3 mmHg.   IAS/Shunts: No atrial level shunt detected by color flow Doppler.      Event monitor 11/02/2016 30-day event monitor: 4 stable events (3 manual) Baseline rhythm sinus rhythm - Average heart rate 64 bpm Minimum heart rate 42 bpm (sinus bradycardia), maximum heart rate 103 bpm (sinus tachycardia). . Rare PVCs but no significant arrhythmias noted. Symptoms of rapid/fast heartbeat or skipped heartbeat noted with sinus rhythm as well as sinus rhythm with PVCs.   Summary: The patient's monitoring period was 11/02/2016 - 12/01/2016. Baseline sample showed Sinus Bradycardia w/Artifact with a heart rate of 59.7 bpm.  There were 0 critical, 0 serious, and 4 stable events that occurred. The report analysis of the critical, serious, stable and manually triggered events are listed below. Manually Detected Events: 1 Stable: Sinus Rhythm  Rapid or Fast Heartbeat 1 Stable: Sinus Rhythm w/PVCs (1 in 1/Min)/Artifact  Rapid or Fast Heartbeat; Flutter or Skipped Beats 1 Stable: Sinus Rhythm w/PVCs (2 in 1/Min)/Artifact  Flutter or Skipped Beats Automatically Detected Events: 1 Stable: Sinus Bradycardia w/Artifact End of summarized findings.Event monitor 11/02/2016 30-day event monitor: 4 stable events (3 manual) Baseline rhythm sinus rhythm - Average heart rate 64 bpm Minimum heart rate 42 bpm (sinus bradycardia),  maximum heart rate 103 bpm (sinus tachycardia). . Rare PVCs but no significant arrhythmias noted. Symptoms of rapid/fast heartbeat or skipped heartbeat noted with sinus rhythm as well as sinus rhythm with PVCs.   Summary: The patient's monitoring period was 11/02/2016 - 12/01/2016. Baseline sample showed Sinus Bradycardia w/Artifact with a heart rate of 59.7 bpm.  There were 0 critical, 0 serious, and 4 stable events that occurred. The report analysis of the critical, serious, stable and manually triggered events are listed below. Manually Detected Events: 1 Stable: Sinus Rhythm  Rapid or Fast Heartbeat 1 Stable: Sinus Rhythm w/PVCs (1 in 1/Min)/Artifact  Rapid or Fast Heartbeat; Flutter or Skipped Beats 1 Stable: Sinus Rhythm w/PVCs (2 in 1/Min)/Artifact  Flutter or Skipped Beats Automatically Detected Events: 1 Stable: Sinus Bradycardia w/Artifact End of summarized findings.  EKG:  No EKG today.   Recent Labs: 12/03/2020: BUN 13; Creatinine, Ser 0.78; Hemoglobin 14.4; Platelets 229; Potassium 4.8; Sodium 137  Recent Lipid Panel No results found for: CHOL, TRIG, HDL, CHOLHDL, VLDL, LDLCALC, LDLDIRECT   Home Medications   Current Meds  Medication Sig  ALPRAZolam (XANAX) 0.5 MG tablet Take 0.5 mg by mouth daily as needed for anxiety.   amLODipine (NORVASC) 5 MG tablet TAKE 1 TO 2 TABLETS BY MOUTH ONCE A WEEK FOR BLOOD PRESSURE OVER 170/90 (Patient taking differently: Take 5 mg by mouth as needed. TAKE 1 TO 2 TABLETS BY MOUTH ONCE A WEEK FOR BLOOD PRESSURE OVER 170/90)   Ascorbic Acid (VITAMIN C PO) Take 1 tablet by mouth daily.   aspirin EC 81 MG tablet Take 81 mg by mouth daily.    calcium carbonate (TUMS - DOSED IN MG ELEMENTAL CALCIUM) 500 MG chewable tablet Chew 1 tablet by mouth daily as needed for indigestion or heartburn.    carvedilol (COREG) 12.5 MG tablet Take 12.5 mg by mouth 2 (two) times daily with a meal.   Cholecalciferol 125 MCG (5000 UT) capsule Take 5,000 Units by  mouth daily.   co-enzyme Q-10 50 MG capsule Take 100 mg by mouth daily.   ezetimibe (ZETIA) 10 MG tablet Take 10 mg by mouth daily.   gabapentin (NEURONTIN) 300 MG capsule Take 300 mg by mouth at bedtime.   irbesartan (AVAPRO) 300 MG tablet Take 300 mg by mouth at bedtime.   ketoconazole (NIZORAL) 2 % shampoo Apply topically 2 (two) times a week.   Levothyroxine Sodium 88 MCG CAPS Take 88 mcg by mouth every morning.   MAGNESIUM PO Take 150 mg by mouth daily. Powder Form   Multiple Vitamin (MULTIVITAMIN WITH MINERALS) TABS tablet Take 1 tablet by mouth daily.   naproxen sodium (ANAPROX) 220 MG tablet Take 440 mg by mouth daily as needed (pain).   neomycin-polymyxin b-dexamethasone (MAXITROL) 3.5-10000-0.1 OINT SMARTSIG:Sparingly In Eye(s) 4 Times Daily   Probiotic Product (PROBIOTIC PO) Take 1 tablet by mouth daily.     Review of Systems      All other systems reviewed and are otherwise negative except as noted above.  Physical Exam    VS:  BP (!) 146/76    Pulse 66    Ht 5\' 6"  (1.676 m)    Wt 223 lb 3.2 oz (101.2 kg)    BMI 36.03 kg/m  , BMI Body mass index is 36.03 kg/m.  Wt Readings from Last 3 Encounters:  01/12/21 223 lb 3.2 oz (101.2 kg)  12/03/20 223 lb (101.2 kg)  09/02/19 216 lb (98 kg)    GEN: Well nourished, well developed, in no acute distress. HEENT: normal. Neck: Supple, no JVD, carotid bruits, or masses. Cardiac: RRR, no murmurs, rubs, or gallops. No clubbing, cyanosis, edema.  Radials/PT 2+ and equal bilaterally.  Respiratory:  Respirations regular and unlabored, clear to auscultation bilaterally. GI: Soft, nontender, nondistended. MS: No deformity or atrophy. Skin: Warm and dry, no rash. Neuro:  Strength and sensation are intact. Psych: Normal affect.  Assessment & Plan    HTN - BP mildly elevated in clinic though known white coat hypertension. SBP at home most often 120s-137 mmHg. Occasional SBP 116 associated with lightheadedness. Recommend regular fluid  intake, regular meals, slow position changes. She will continue Amlodipine PRN for BP >170/90 (cannot take regularly due to s/e of tongue ulcer). Continue Irbesartan 150mg  BID, Coreg 12.5mg  BID. Heart healthy diet and regular cardiovascular exercise encouraged.    MR - Moderate by echo 12/2020. Continue optimal BP and volume control. No volume overload on exam today.   HLD - Statin intolerance. Continue Zetia 10mg  QD.   Of note, her husband was present for visit and needed refill on cardiac medications. This was  addressed in an 'orders only' encounter on his medical chart. Kardia information provided on her AVS per her request as she had seen commercial.  Disposition: Follow up in 6 month(s) with Pixie Casino, MD or APP.  Signed, Loel Dubonnet, NP 01/12/2021, 11:22 AM [place]

## 2021-01-12 ENCOUNTER — Encounter (HOSPITAL_BASED_OUTPATIENT_CLINIC_OR_DEPARTMENT_OTHER): Payer: Self-pay | Admitting: Family

## 2021-01-12 ENCOUNTER — Ambulatory Visit (HOSPITAL_BASED_OUTPATIENT_CLINIC_OR_DEPARTMENT_OTHER): Payer: PPO | Admitting: Family

## 2021-01-12 ENCOUNTER — Other Ambulatory Visit: Payer: Self-pay

## 2021-01-12 VITALS — BP 146/76 | HR 66 | Ht 66.0 in | Wt 223.2 lb

## 2021-01-12 DIAGNOSIS — I10 Essential (primary) hypertension: Secondary | ICD-10-CM

## 2021-01-12 DIAGNOSIS — E782 Mixed hyperlipidemia: Secondary | ICD-10-CM | POA: Diagnosis not present

## 2021-01-12 DIAGNOSIS — I340 Nonrheumatic mitral (valve) insufficiency: Secondary | ICD-10-CM | POA: Diagnosis not present

## 2021-01-12 NOTE — Patient Instructions (Addendum)
Medication Instructions:  Continue your current medications.   *If you need a refill on your cardiac medications before your next appointment, please call your pharmacy*  Lab Work: None ordered today.   Testing/Procedures: Your echocardiogram showed normal heart pumping function and your mitral valve was moderately leaky. This is overall stable. We prevent any future problems by keeping your blood pressure well controlled.    Follow-Up: At [facility], you and your health needs are our priority.  As part of our continuing mission to provide you with exceptional heart care, we have created designated Provider Care Teams.  These Care Teams include your primary Cardiologist (physician) and Advanced Practice Providers (APPs -  Physician Assistants and Nurse Practitioners) who all work together to provide you with the care you need, when you need it.  We recommend signing up for the patient portal called "MyChart".  Sign up information is provided on this After Visit Summary.  MyChart is used to connect with patients for Virtual Visits (Telemedicine).  Patients are able to view lab/test results, encounter notes, upcoming appointments, etc.  Non-urgent messages can be sent to your provider as well.   To learn more about what you can do with MyChart, go to [email or web address].    Your next appointment:   6 month(s)  The format for your next appointment:   In Person  Provider:   Pixie Casino, MD or Advanced Practice provider     Other Instructions  Heart Healthy Diet Recommendations: A low-salt diet is recommended. Meats should be grilled, baked, or boiled. Avoid fried foods. Focus on lean protein sources like fish or chicken with vegetables and fruits. The American Heart Association is a Microbiologist!  American Heart Association Diet and Lifeystyle Recommendations    Exercise recommendations: The American Heart Association recommends 150 minutes of moderate intensity exercise  weekly. Try 30 minutes of moderate intensity exercise 4-5 times per week. This could include walking, jogging, or swimming.    Kardia for monitoring of EKGs at home: You can look into the [facility] device by [employer].This device is purchased by you and it connects to an application you download to your smart phone.  It can detect abnormal heart rhythms and alert you to contact your doctor for further evaluation. The device is approximately $90 and the phone application is free. You do *not* need the membership.   The web site is:  https://www.alivecor.com

## 2021-04-15 DIAGNOSIS — M79672 Pain in left foot: Secondary | ICD-10-CM | POA: Insufficient documentation

## 2021-04-21 ENCOUNTER — Telehealth (HOSPITAL_BASED_OUTPATIENT_CLINIC_OR_DEPARTMENT_OTHER): Payer: Self-pay

## 2021-04-21 NOTE — Telephone Encounter (Signed)
? ?  Name: KYMONI LESPERANCE  ?DOB: August 24, 1945  ?MRN: [id number]  ? ?Primary Cardiologist: Pixie Casino, MD ? ?Chart reviewed as part of pre-operative protocol coverage. We are asked for guidance to hold ASA. Pt hs no known CAD but carries the diagnosis of stroke. Defer to PCP for ASA hold. ? ?I will route this recommendation to the requesting party via Epic fax function and remove from pre-op pool. Please call with questions. ? ?Ledora Bottcher, PA ?04/21/2021, 11:23 AM ? ?

## 2021-04-21 NOTE — Telephone Encounter (Signed)
? ?  Pre-operative Risk Assessment  ?  ?Patient Name: Alexandria Hayes  ?DOB: [birth year] ?MRN: [id number]  ? ?  ? ?Request for Surgical Clearance   ? ?Procedure:   left 1st MTP arthrodesis  ? ?Date of Surgery:  Clearance TBD                              ?   ?Surgeon:  Dr. Armond Hang  ?Surgeon's Group or Practice Name:  [facility]  ?Phone number:  [phone] ?Fax number:  Adline Potter- [phone] ?  ?Type of Clearance Requested:   ?- Pharmacy:  Hold Does patient need to hold anything?   Takes ASA '81mg'$   ?  ?Type of Anesthesia:  Not Indicated ?  ?Additional requests/questions:  Please fax clearance and any pre-op instructions to the number listed above Attn: Caryl Pina ?Signed, ?Gerald Stabs   ?04/21/2021, 10:50 AM  ? ?

## 2021-05-03 ENCOUNTER — Encounter (HOSPITAL_BASED_OUTPATIENT_CLINIC_OR_DEPARTMENT_OTHER): Payer: Self-pay

## 2021-05-03 HISTORY — PX: TOE FUSION: SHX1070

## 2021-05-03 NOTE — Telephone Encounter (Signed)
Please advise 

## 2021-05-04 ENCOUNTER — Ambulatory Visit: Payer: PPO | Attending: Internal Medicine

## 2021-05-04 ENCOUNTER — Encounter (HOSPITAL_BASED_OUTPATIENT_CLINIC_OR_DEPARTMENT_OTHER): Payer: Self-pay

## 2021-05-04 ENCOUNTER — Other Ambulatory Visit (HOSPITAL_BASED_OUTPATIENT_CLINIC_OR_DEPARTMENT_OTHER): Payer: Self-pay

## 2021-05-04 DIAGNOSIS — Z23 Encounter for immunization: Secondary | ICD-10-CM

## 2021-05-04 MED ORDER — PFIZER COVID-19 VAC BIVALENT 30 MCG/0.3ML IM SUSP
INTRAMUSCULAR | 0 refills | Status: DC
  Filled 2021-05-04: qty 0.3, 1d supply, fill #0

## 2021-05-04 NOTE — Telephone Encounter (Signed)
? ? ?  Patient Name: Alexandria Hayes  ?DOB: Jun 30, 1945 ?MRN: [id number] ? ?Primary Cardiologist: Pixie Casino, MD ? ?Chart reviewed as part of pre-operative protocol coverage. Given past medical history and time since last visit, based on ACC/AHA guidelines, Alexandria Hayes would be at acceptable risk for the planned procedure without further cardiovascular testing.  ? ?Miss [name] sent Mychart message that [facility] needed further clarification - prior clearance only requested for pharmacy clearance. She has no history of CAD/PAD. From cardiac perspective permissible to hold Aspirin but would recommend final review by PCP.  ? ?The patient was advised that if she develops new symptoms prior to surgery to contact our office to arrange for a follow-up visit, and she verbalized understanding. ? ?I will route this recommendation to the requesting party via Epic fax function and remove from pre-op pool. ? ?Please call with questions. ? ?Loel Dubonnet, NP ?05/04/2021, 2:29 PM  ?

## 2021-05-04 NOTE — Progress Notes (Signed)
? ?  Covid-19 Vaccination Clinic ? ?Name:  Alexandria Hayes    ?MRN: [id number] ?DOB: 09-03-1945 ? ?05/04/2021 ? ?Ms. Comp was observed post Covid-19 immunization for 15 minutes without incident. She was provided with Vaccine Information Sheet and instruction to access the V-Safe system.  ? ?[name] was instructed to call 911 with any severe reactions post vaccine: ?Difficulty breathing  ?Swelling of face and throat  ?A fast heartbeat  ?A bad rash all over body  ?Dizziness and weakness  ? ?Immunizations Administered   ? ? Name Date Dose VIS Date Route  ? Ambulance person Booster 05/04/2021 12:18 PM 0.3 mL 09/02/2020 Intramuscular  ? Manufacturer: [place]: [id number]  ? [place]: [phone]  ? ?  ? ? ?

## 2021-05-04 NOTE — Telephone Encounter (Signed)
Hey I saw where she saw you, but it doesn't look like yall talked about a pre-op? I see something from the day after with Fabian Sharp and looks like she deferred to PCP, am I okay to tell her to reach out to PCP regarding clearance?  ?

## 2021-10-18 NOTE — Progress Notes (Unsigned)
Office Visit    Patient Name: Alexandria Hayes Date of Encounter: 10/19/2021  PCP:  Algis Greenhouse, MD   [place]  Cardiologist:  Pixie Casino, MD  Advanced Practice Provider:  No care team member to display Electrophysiologist:  None      Chief Complaint    Alexandria Hayes is a 76 y.o. female  presents today for follow up of MR and HTN  Past Medical History    Past Medical History:  Diagnosis Date   Chronic fatigue disorder    Essential hypertension    History of closed head injury 05/2012   Skull fracture and left forehead; related to trauma   Hypothyroid    On replacement therapy   Migraines    Stroke [facility])    Past Surgical History:  Procedure Laterality Date   ABDOMINAL HYSTERECTOMY  12/2010   BACK SURGERY  11/2007   L4-L5 fusion   CATARACT EXTRACTION, BILATERAL  09/2009   REPLACEMENT TOTAL KNEE BILATERAL Bilateral 2011   February and October    Allergies  Allergies  Allergen Reactions   Amlodipine Rash    Blisters on gums   Clonidine Other (See Comments)    GI Upset (intolerance) Dry mouth Dizziness   Nitrofuran Derivatives Anaphylaxis, Swelling and Rash        Triamcinolone Acetonide Anxiety    hypertension Hypertension - Patient states that this led to a stroke    Atorvastatin Other (See Comments)    Back pain   Simvastatin Other (See Comments)    memory   Ciprofloxacin Nausea Only   Cortisone Anxiety and Hypertension   Erythromycin Rash   Levaquin [Levofloxacin In D5w] Anxiety and Hypertension   Penicillins Rash    Has patient had a PCN reaction causing immediate rash, facial/tongue/throat swelling, SOB or lightheadedness with hypotension: Yes Has patient had a PCN reaction causing severe rash involving mucus membranes or skin necrosis: No Has patient had a PCN reaction that required hospitalization No Has patient had a PCN reaction occurring within the last 10 years: No If all of the above answers are  "NO", then may proceed with Cephalosporin use.    Prochlorperazine Edisylate Anxiety and Hypertension    hypertension   Tetracyclines & Related Rash   Wound Dressing Adhesive Itching and Rash    History of Present Illness    Alexandria Hayes is a 76 y.o. female with a hx of chronic fatigue, fibromyalgia, hypertension with orthostasis, labile blood pressure, migraine, seizure  last seen 01/03/21  Miss Bezold and her husband are both [employer] and follow with Dr. Debara Pickett.  She has multiple medication intolerances including amlodipine, clonidine, antibiotics.  Previous cardiac monitoring 2018 with sinus bradycardia with rate 59 bpm, occasional PVC, otherwise no significant findings.  She was seen June 2021 and BP well controlled on carvedilol and irbesartan. She was seen by [facility] and diagnosed with macular focal retinitis.  She was recommended for carotid Doppler and echocardiogram by her eye doctor.  Carotid duplex showed minimal plaque in carotid arteries with no significant stenosis.  Echocardiogram 12/16/20 with normal LVEF, mild MR.  She was seen 12/03/2020 by Nicholes Rough, PA. Noted difficulties with elevated BP as well as orthostasis. PCP had discontinued Hydralazine , Avapro transitioned to '150mg'$  BID, Amlodipine PRN, Coreg reduced to 6.'25mg'$  BID. BP in clinic 170/106 sitting and 168/110 standing. Echocardiogram was ordered. Her reported home BPs were in the 120s and no medication changes were made.  Echo 12/16/20 LVEF 63%, no RWMA, gr1DD, RV normal size and function, normal PASP, moderate MR, trivial AI, mild dilation ascending aorta 39m.   Last seen 01/12/21. She had reduced Carvedilol to half tablet with improvement in symptoms and BP at home 120s-130s. Her medication regimen and she was recommended to follow up in 6 months.   She presents today for follow up with her husband. She notes that her home blood pressures have been ranging from 120-130's. She does check BP daily and  takes Amlodipine '5mg'$  as needed if her BP rises >170. 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 Since the toe surgery, more sedentary lifestyle and has gained 12lbs. She notes that she has been SOB intermittently, which she attributes to general deconditioning. She has been cleared by Ortho to start walking and exercising just last week. She plans to start walking again with goal to lose weight and increase stamina.   EKGs/Labs/Other Studies Reviewed:   The following studies were reviewed today:  Echo 12/16/20  1. Left ventricular ejection fraction by 3D volume is 63 %. The left  ventricle has normal function. The left ventricle has no regional wall  motion abnormalities. Left ventricular diastolic parameters are consistent  with Grade I diastolic dysfunction  (impaired relaxation). Elevated left atrial pressure. The average left  ventricular global longitudinal strain is -19.2 %. The global longitudinal  strain is normal.   2. Right ventricular systolic function is normal. The right ventricular  size is normal. There is normal pulmonary artery systolic pressure. The  estimated right ventricular systolic pressure is 335.3mmHg.   3. The mitral valve is normal in structure. Moderate mitral valve  regurgitation. No evidence of mitral stenosis.   4. The aortic valve is tricuspid. There is moderate calcification of the  aortic valve. There is moderate thickening of the aortic valve. Aortic  valve regurgitation is trivial. Aortic valve sclerosis/calcification is  present, without any evidence of  aortic stenosis. Aortic valve Vmax measures 1.69 m/s.   5. Aortic dilatation noted. There is mild dilatation of the ascending  aorta, measuring 40 mm.   6. The inferior vena cava is normal in size with greater than 50%  respiratory variability, suggesting right atrial pressure of 3 mmHg.   Comparison(s): EF 60%, GLS -21.2%, mild aortic sclerosis no  AS.   Echocardiogram 08/2019 IMPRESSIONS     1. Left ventricular ejection fraction, by estimation, is 60 to 65%. The  left ventricle has normal function. The left ventricle has no regional  wall motion abnormalities. Left ventricular diastolic parameters are  consistent with Grade I diastolic  dysfunction (impaired relaxation). The average left ventricular global  longitudinal strain is -21.2 %. The global longitudinal strain is normal.   2. Right ventricular systolic function is normal. The right ventricular  size is normal.   3. The mitral valve is abnormal. Mild mitral valve regurgitation.   4. The aortic valve is tricuspid. Aortic valve regurgitation is not  visualized. Mild to moderate aortic valve sclerosis/calcification is  present, without any evidence of aortic stenosis.   5. The inferior vena cava is normal in size with greater than 50%  respiratory variability, suggesting right atrial pressure of 3 mmHg.   FINDINGS   Left Ventricle: Left ventricular ejection fraction, by estimation, is 60  to 65%. The left ventricle has normal function. The left ventricle has no  regional wall motion abnormalities. The average left ventricular global  longitudinal strain is -21.2 %.  The global longitudinal strain is normal. The left ventricular internal  cavity size was normal in size. There is no left ventricular hypertrophy.  Left ventricular diastolic parameters are consistent with Grade I  diastolic dysfunction (impaired  relaxation). Indeterminate filling pressures.   Right Ventricle: The right ventricular size is normal. No increase in  right ventricular wall thickness. Right ventricular systolic function is  normal.   Left Atrium: Left atrial size was normal in size.   Right Atrium: Right atrial size was normal in size.   Pericardium: There is no evidence of pericardial effusion.   Mitral Valve: The mitral valve is abnormal. There is mild thickening of  the mitral valve  leaflet(s). There is mild calcification of the mitral  valve leaflet(s). Mild mitral valve regurgitation.   Tricuspid Valve: The tricuspid valve is grossly normal. Tricuspid valve  regurgitation is trivial.   Aortic Valve: The aortic valve is tricuspid. Aortic valve regurgitation is  not visualized. Mild to moderate aortic valve sclerosis/calcification is  present, without any evidence of aortic stenosis.   Pulmonic Valve: The pulmonic valve was normal in structure. Pulmonic valve  regurgitation is not visualized.   Aorta: The aortic root and ascending aorta are structurally normal, with  no evidence of dilitation.   Venous: The inferior vena cava is normal in size with greater than 50%  respiratory variability, suggesting right atrial pressure of 3 mmHg.   IAS/Shunts: No atrial level shunt detected by color flow Doppler.      Event monitor 11/02/2016 30-day event monitor: 4 stable events (3 manual) Baseline rhythm sinus rhythm - Average heart rate 64 bpm Minimum heart rate 42 bpm (sinus bradycardia), maximum heart rate 103 bpm (sinus tachycardia). . Rare PVCs but no significant arrhythmias noted. Symptoms of rapid/fast heartbeat or skipped heartbeat noted with sinus rhythm as well as sinus rhythm with PVCs.   Summary: The patient's monitoring period was 11/02/2016 - 12/01/2016. Baseline sample showed Sinus Bradycardia w/Artifact with a heart rate of 59.7 bpm.  There were 0 critical, 0 serious, and 4 stable events that occurred. The report analysis of the critical, serious, stable and manually triggered events are listed below. Manually Detected Events: 1 Stable: Sinus Rhythm  Rapid or Fast Heartbeat 1 Stable: Sinus Rhythm w/PVCs (1 in 1/Min)/Artifact  Rapid or Fast Heartbeat; Flutter or Skipped Beats 1 Stable: Sinus Rhythm w/PVCs (2 in 1/Min)/Artifact  Flutter or Skipped Beats Automatically Detected Events: 1 Stable: Sinus Bradycardia w/Artifact End of summarized  findings.Event monitor 11/02/2016 30-day event monitor: 4 stable events (3 manual) Baseline rhythm sinus rhythm - Average heart rate 64 bpm Minimum heart rate 42 bpm (sinus bradycardia), maximum heart rate 103 bpm (sinus tachycardia). . Rare PVCs but no significant arrhythmias noted. Symptoms of rapid/fast heartbeat or skipped heartbeat noted with sinus rhythm as well as sinus rhythm with PVCs.   Summary: The patient's monitoring period was 11/02/2016 - 12/01/2016. Baseline sample showed Sinus Bradycardia w/Artifact with a heart rate of 59.7 bpm.  There were 0 critical, 0 serious, and 4 stable events that occurred. The report analysis of the critical, serious, stable and manually triggered events are listed below. Manually Detected Events: 1 Stable: Sinus Rhythm  Rapid or Fast Heartbeat 1 Stable: Sinus Rhythm w/PVCs (1 in 1/Min)/Artifact  Rapid or Fast Heartbeat; Flutter or Skipped Beats 1 Stable: Sinus Rhythm w/PVCs (2 in 1/Min)/Artifact  Flutter or Skipped Beats Automatically Detected Events: 1 Stable: Sinus Bradycardia w/Artifact End of summarized findings.  EKG:  No EKG today.   Recent Labs: 12/03/2020: BUN 13; Creatinine, Ser 0.78; Hemoglobin 14.4; Platelets 229; Potassium 4.8; Sodium 137  Recent Lipid Panel No results found for: "CHOL", "TRIG", "HDL", "CHOLHDL", "VLDL", "LDLCALC", "LDLDIRECT"   Home Medications   Current Meds  Medication Sig   ALPRAZolam (XANAX) 0.5 MG tablet Take 0.5 mg by mouth daily as needed for anxiety.   amLODipine (NORVASC) 5 MG tablet TAKE 1 TO 2 TABLETS BY MOUTH ONCE A WEEK FOR BLOOD PRESSURE OVER 170/90 (Patient taking differently: Take 5 mg by mouth as needed. TAKE 1 TO 2 TABLETS BY MOUTH ONCE A WEEK FOR BLOOD PRESSURE OVER 170/90)   Ascorbic Acid (VITAMIN C PO) Take 1 tablet by mouth daily.   aspirin EC 81 MG tablet Take 81 mg by mouth daily.    calcium carbonate (TUMS - DOSED IN MG ELEMENTAL CALCIUM) 500 MG chewable tablet Chew 1 tablet by mouth  daily as needed for indigestion or heartburn.    carvedilol (COREG) 12.5 MG tablet Take 12.5 mg by mouth 2 (two) times daily with a meal.   Cholecalciferol 125 MCG (5000 UT) capsule Take 5,000 Units by mouth daily.   co-enzyme Q-10 50 MG capsule Take 100 mg by mouth daily.   COVID-19 mRNA bivalent vaccine, Pfizer, (PFIZER COVID-19 VAC BIVALENT) injection Inject into the muscle.   ezetimibe (ZETIA) 10 MG tablet Take 10 mg by mouth daily.   gabapentin (NEURONTIN) 300 MG capsule Take 300 mg by mouth at bedtime.   irbesartan (AVAPRO) 300 MG tablet Take 300 mg by mouth at bedtime.   ketoconazole (NIZORAL) 2 % shampoo Apply topically 2 (two) times a week.   Levothyroxine Sodium 88 MCG CAPS Take 88 mcg by mouth every morning.   MAGNESIUM PO Take 150 mg by mouth daily. Powder Form   Multiple Vitamin (MULTIVITAMIN WITH MINERALS) TABS tablet Take 1 tablet by mouth daily.   naproxen sodium (ANAPROX) 220 MG tablet Take 440 mg by mouth daily as needed (pain).   neomycin-polymyxin b-dexamethasone (MAXITROL) 3.5-10000-0.1 OINT SMARTSIG:Sparingly In Eye(s) 4 Times Daily   Probiotic Product (PROBIOTIC PO) Take 1 tablet by mouth daily.     Review of Systems      All other systems reviewed and are otherwise negative except as noted above.  Physical Exam    VS:  BP (!) 152/90 (BP Location: Left Arm, Patient Position: Sitting)   Pulse 63   Ht '5\' 6"'$  (1.676 m)   Wt 235 lb (106.6 kg)   BMI 37.93 kg/m  , BMI Body mass index is 37.93 kg/m.  Wt Readings from Last 3 Encounters:  10/19/21 235 lb (106.6 kg)  01/12/21 223 lb 3.2 oz (101.2 kg)  12/03/20 223 lb (101.2 kg)    GEN: Well nourished, well developed, in no acute distress. HEENT: normal. Neck: Supple, no JVD, carotid bruits, or masses. Cardiac: RRR, no murmurs, rubs, or gallops. No clubbing, cyanosis, edema.  Radials/PT 2+ and equal bilaterally.  Respiratory:  Respirations regular and unlabored, clear to auscultation bilaterally. GI: Soft,  nontender, nondistended. MS: No deformity or atrophy. Skin: Warm and dry, no rash. Neuro:  Strength and sensation are intact. Psych: Normal affect.  Assessment & Plan    HTN -  Elevated in office initially 188/102 with repeat 152/90 withoutintervention. She does have documented hx of white coat HTN. SBP at home is 120-130s mmHg. She does not lightheadedness with BP's lower than 120. Encouraged fluid intakr and slow position changes. Continue current BP regimen Irbesartan '150mg'$   BID and take PRN Amlodipine for BP >170/90. Discussed to monitor BP at home at least 2 hours after medications and sitting for 5-10 minutes.   MR - Moderate by echo 12/2020. Continue optimal BP and volume control. Will order ECHO for 12/2021.   HLD / Obesity - Statin intolerance. LDL 134. No hx of CAD. Continue Zetia '10mg'$  QD. Encouraged weight loss through 150 minutes moderate intensity exercise weekly.  Disposition: Follow up in 6 month(s) with Pixie Casino, MD or APP.  Signed, Loel Dubonnet, NP 10/19/2021, 12:24 PM [place]

## 2021-10-19 ENCOUNTER — Other Ambulatory Visit (HOSPITAL_BASED_OUTPATIENT_CLINIC_OR_DEPARTMENT_OTHER): Payer: Self-pay

## 2021-10-19 ENCOUNTER — Ambulatory Visit (HOSPITAL_BASED_OUTPATIENT_CLINIC_OR_DEPARTMENT_OTHER): Payer: PPO | Admitting: Family

## 2021-10-19 ENCOUNTER — Encounter (HOSPITAL_BASED_OUTPATIENT_CLINIC_OR_DEPARTMENT_OTHER): Payer: Self-pay | Admitting: Family

## 2021-10-19 VITALS — BP 152/90 | HR 63 | Ht 66.0 in | Wt 235.0 lb

## 2021-10-19 DIAGNOSIS — I340 Nonrheumatic mitral (valve) insufficiency: Secondary | ICD-10-CM

## 2021-10-19 DIAGNOSIS — Z6837 Body mass index (BMI) 37.0-37.9, adult: Secondary | ICD-10-CM | POA: Diagnosis not present

## 2021-10-19 DIAGNOSIS — I10 Essential (primary) hypertension: Secondary | ICD-10-CM

## 2021-10-19 DIAGNOSIS — E782 Mixed hyperlipidemia: Secondary | ICD-10-CM

## 2021-10-19 MED ORDER — COVID-19 MRNA VAC-TRIS(PFIZER) 30 MCG/0.3ML IM SUSY
PREFILLED_SYRINGE | INTRAMUSCULAR | 0 refills | Status: DC
  Filled 2021-10-19: qty 0.3, 1d supply, fill #0

## 2021-10-19 NOTE — Patient Instructions (Addendum)
Medication Instructions:  Your Physician recommend you continue on your current medication as directed.    *If you need a refill on your cardiac medications before your next appointment, please call your pharmacy*  Testing/Procedures: Your physician has requested that you have an echocardiogram on December 12th at 11am at the [facility] location. Echocardiography is a painless test that uses sound waves to create images of your heart. It provides your doctor with information about the size and shape of your heart and how well your heart's chambers and valves are working. This procedure takes approximately one hour. There are no restrictions for this procedure. [place], you and your health needs are our priority.  As part of our continuing mission to provide you with exceptional heart care, we have created designated Provider Care Teams.  These Care Teams include your primary Cardiologist (physician) and Advanced Practice Providers (APPs -  Physician Assistants and Nurse Practitioners) who all work together to provide you with the care you need, when you need it.  We recommend signing up for the patient portal called "MyChart".  Sign up information is provided on this After Visit Summary.  MyChart is used to connect with patients for Virtual Visits (Telemedicine).  Patients are able to view lab/test results, encounter notes, upcoming appointments, etc.  Non-urgent messages can be sent to your provider as well.   To learn more about what you can do with MyChart, go to [email or web address].    Your next appointment:   Follow up in 6 months- we will send you a letter to schedule.

## 2021-10-30 ENCOUNTER — Other Ambulatory Visit: Payer: Self-pay | Admitting: Internal Medicine

## 2021-12-14 ENCOUNTER — Ambulatory Visit (INDEPENDENT_AMBULATORY_CARE_PROVIDER_SITE_OTHER): Payer: PPO

## 2021-12-14 DIAGNOSIS — I340 Nonrheumatic mitral (valve) insufficiency: Secondary | ICD-10-CM | POA: Diagnosis not present

## 2021-12-14 LAB — ECHOCARDIOGRAM COMPLETE
Area-P 1/2: 3.65 cm2
MV M vel: 2.81 m/s
MV Peak grad: 31.6 mmHg
Radius: 0.3 cm
S' Lateral: 2.82 cm

## 2022-02-28 ENCOUNTER — Encounter: Payer: Self-pay | Admitting: Family Medicine

## 2022-02-28 DIAGNOSIS — Z1231 Encounter for screening mammogram for malignant neoplasm of breast: Secondary | ICD-10-CM

## 2022-03-01 DIAGNOSIS — N6489 Other specified disorders of breast: Secondary | ICD-10-CM | POA: Insufficient documentation

## 2022-03-03 ENCOUNTER — Other Ambulatory Visit: Payer: Self-pay | Admitting: Family Medicine

## 2022-03-03 DIAGNOSIS — N6489 Other specified disorders of breast: Secondary | ICD-10-CM

## 2022-03-16 ENCOUNTER — Ambulatory Visit
Admission: RE | Admit: 2022-03-16 | Discharge: 2022-03-16 | Disposition: A | Discharge status: Home / Self Care | Payer: PPO | Source: Ambulatory Visit | Attending: Family Medicine | Admitting: Family Medicine

## 2022-03-16 DIAGNOSIS — N6489 Other specified disorders of breast: Secondary | ICD-10-CM

## 2022-05-10 DIAGNOSIS — H04552 Acquired stenosis of left nasolacrimal duct: Secondary | ICD-10-CM | POA: Insufficient documentation

## 2022-05-10 DIAGNOSIS — R040 Epistaxis: Secondary | ICD-10-CM | POA: Insufficient documentation

## 2022-05-10 DIAGNOSIS — H9313 Tinnitus, bilateral: Secondary | ICD-10-CM | POA: Insufficient documentation

## 2022-05-10 DIAGNOSIS — M545 Low back pain, unspecified: Secondary | ICD-10-CM | POA: Insufficient documentation

## 2022-05-10 DIAGNOSIS — J342 Deviated nasal septum: Secondary | ICD-10-CM | POA: Insufficient documentation

## 2022-12-15 ENCOUNTER — Encounter: Payer: Self-pay | Admitting: Dermatology

## 2022-12-15 ENCOUNTER — Ambulatory Visit: Payer: PPO | Admitting: Dermatology

## 2022-12-15 VITALS — BP 157/101 | HR 66

## 2022-12-15 DIAGNOSIS — D485 Neoplasm of uncertain behavior of skin: Secondary | ICD-10-CM

## 2022-12-15 DIAGNOSIS — L570 Actinic keratosis: Secondary | ICD-10-CM

## 2022-12-15 DIAGNOSIS — L719 Rosacea, unspecified: Secondary | ICD-10-CM

## 2022-12-15 DIAGNOSIS — D0461 Carcinoma in situ of skin of right upper limb, including shoulder: Secondary | ICD-10-CM

## 2022-12-15 DIAGNOSIS — W908XXA Exposure to other nonionizing radiation, initial encounter: Secondary | ICD-10-CM

## 2022-12-15 DIAGNOSIS — D492 Neoplasm of unspecified behavior of bone, soft tissue, and skin: Secondary | ICD-10-CM

## 2022-12-15 DIAGNOSIS — C4492 Squamous cell carcinoma of skin, unspecified: Secondary | ICD-10-CM

## 2022-12-15 DIAGNOSIS — R0789 Other chest pain: Secondary | ICD-10-CM | POA: Insufficient documentation

## 2022-12-15 DIAGNOSIS — M797 Fibromyalgia: Secondary | ICD-10-CM | POA: Insufficient documentation

## 2022-12-15 HISTORY — DX: Squamous cell carcinoma of skin, unspecified: C44.92

## 2022-12-15 MED ORDER — METRONIDAZOLE 0.75 % EX CREA: TOPICAL_CREAM | Freq: Two times a day (BID) | CUTANEOUS | 5 refills | Status: AC

## 2022-12-15 NOTE — Patient Instructions (Addendum)

## 2022-12-15 NOTE — Progress Notes (Signed)
New Patient Visit   Subjective  Alexandria Hayes is a 77 y.o. female who presents for the following: Rosacea  Patient states she has rosacea located at the face that she would like to have examined. Patient reports the areas have been there for 5 years. She reports the areas are not bothersome. Patient rates irritation 0 out of 10. She states that the areas have not spread. Patient reports she has previously been treated for these areas with Metro Cream. Patient denies Hx of bx. Patient denies family history of skin cancer(s).  The patient has spots, moles and lesions to be evaluated, some may be new or changing and the patient may have concern these could be cancer.   The following portions of the chart were reviewed this encounter and updated as appropriate: medications, allergies, medical history  Review of Systems:  No other skin or systemic complaints except as noted in HPI or Assessment and Plan.  Objective  Well appearing patient in no apparent distress; mood and affect are within normal limits.  A focused examination was performed of the following areas: Face And Arm  Relevant exam findings are noted in the Assessment and Plan.       Right Forearm - Posterior 1 cm pink scaly papule Right Malar Cheek Erythematous thin papules/macules with gritty scale.   Assessment & Plan   ROSACEA Exam: Mid face erythema with telangiectasias +/- scattered inflammatory papules  Well controlled  Rosacea is a chronic progressive skin condition usually affecting the face of adults, causing redness and/or acne bumps. It is treatable but not curable. It sometimes affects the eyes (ocular rosacea) as well. It may respond to topical and/or systemic medication and can flare with stress, sun exposure, alcohol, exercise, topical steroids (including hydrocortisone/cortisone 10) and some foods.  Daily application of broad spectrum spf 30+ sunscreen to face is recommended to reduce flares.  Patient  reports grittiness of the eyes  Treatment Plan: - Recommended continuing Metro Cream 2 times daily at the first sign of a flare until the flare resolves  ACTINIC KERATOSIS Exam: Erythematous thin papules/macules with gritty scale Right Malar Cheek  Actinic keratoses are precancerous spots that appear secondary to cumulative UV radiation exposure/sun exposure over time. They are chronic with expected duration over 1 year. A portion of actinic keratoses will progress to squamous cell carcinoma of the skin. It is not possible to reliably predict which spots will progress to skin cancer and so treatment is recommended to prevent development of skin cancer.  Recommend daily broad spectrum sunscreen SPF 30+ to sun-exposed areas, reapply every 2 hours as needed.  Recommend staying in the shade or wearing long sleeves, sun glasses (UVA+UVB protection) and wide brim hats (4-inch brim around the entire circumference of the hat). Call for new or changing lesions.  Treatment Plan:  Prior to procedure, discussed risks of blister formation, small wound, skin dyspigmentation, or rare scar following cryotherapy. Recommend Vaseline ointment to treated areas while healing. NEOPLASM OF UNCERTAIN BEHAVIOR OF SKIN Right Forearm - Posterior Skin / nail biopsy Type of biopsy: tangential   Informed consent: discussed and consent obtained   Timeout: patient name, date of birth, surgical site, and procedure verified   Procedure prep:  Patient was prepped and draped in usual sterile fashion Prep type:  Isopropyl alcohol Anesthesia: the lesion was anesthetized in a standard fashion   Anesthetic:  1% lidocaine w/ epinephrine 1-100,000 buffered w/ 8.4% NaHCO3 Instrument used: DermaBlade   Hemostasis achieved with: aluminum  chloride   Outcome: patient tolerated procedure well   Post-procedure details: sterile dressing applied and wound care instructions given   Dressing type: petrolatum gauze and bandage   Specimen  1 - Surgical pathology Differential Diagnosis: SCC  Check Margins: No AK (ACTINIC KERATOSIS) Right Malar Cheek Destruction of lesion - Right Malar Cheek Complexity: simple   Destruction method: cryotherapy   Informed consent: discussed and consent obtained   Timeout:  patient name, date of birth, surgical site, and procedure verified Lesion destroyed using liquid nitrogen: Yes   Cryotherapy cycles:  1 Post-procedure details: wound care instructions given   ROSACEA   Related Medications metroNIDAZOLE (METROCREAM) 0.75 % cream Apply topically 2 (two) times daily.  Return in about 6 months (around 06/15/2023) for AK F/U.    Documentation: I have reviewed the above documentation for accuracy and completeness, and I agree with the above.  Stasia Cavalier, am acting as scribe for Langston Reusing, DO.  Langston Reusing, DO

## 2022-12-20 LAB — SURGICAL PATHOLOGY

## 2022-12-21 ENCOUNTER — Telehealth: Payer: Self-pay

## 2022-12-21 DIAGNOSIS — D099 Carcinoma in situ, unspecified: Secondary | ICD-10-CM | POA: Insufficient documentation

## 2022-12-21 HISTORY — DX: Carcinoma in situ, unspecified: D09.9

## 2022-12-21 NOTE — Progress Notes (Signed)
Hi Shirron,  Please call pt and notify their bx results were positive for a superficial skin CA that will require treatment with cryotherapy in office.  Pt can schedule a follow up for January / February.  FINAL DIAGNOSIS        1. Skin, right forearm - posterior :       SQUAMOUS CELL CARCINOMA IN SITU

## 2022-12-21 NOTE — Telephone Encounter (Signed)
-----   Message from Langston Reusing sent at 12/21/2022  8:52 AM EST ----- Hi Shanisha Lech,  Please call pt and notify their bx results were positive for a superficial skin CA that will require treatment with cryotherapy in office.  Pt can schedule a follow up for January / February.  FINAL DIAGNOSIS        1. Skin, right forearm - posterior :       SQUAMOUS CELL CARCINOMA IN SITU

## 2022-12-21 NOTE — Telephone Encounter (Signed)
Pt has been informed of results and expressed understanding.  °

## 2022-12-21 NOTE — Telephone Encounter (Signed)
Called pt, LVM to discuss.  

## 2022-12-22 NOTE — Telephone Encounter (Signed)
Onalee Hua, DO 03/02/2023 at 1:45 PM

## 2023-01-17 ENCOUNTER — Ambulatory Visit: Payer: PPO | Admitting: Dermatology

## 2023-03-02 ENCOUNTER — Ambulatory Visit: Payer: PPO | Admitting: Dermatology

## 2023-03-02 ENCOUNTER — Encounter: Payer: Self-pay | Admitting: Dermatology

## 2023-03-02 DIAGNOSIS — D0461 Carcinoma in situ of skin of right upper limb, including shoulder: Secondary | ICD-10-CM

## 2023-03-02 DIAGNOSIS — L719 Rosacea, unspecified: Secondary | ICD-10-CM | POA: Diagnosis not present

## 2023-03-02 DIAGNOSIS — L570 Actinic keratosis: Secondary | ICD-10-CM

## 2023-03-02 DIAGNOSIS — D099 Carcinoma in situ, unspecified: Secondary | ICD-10-CM

## 2023-03-02 NOTE — Patient Instructions (Signed)

## 2023-03-02 NOTE — Progress Notes (Addendum)
   Follow-Up Visit   Subjective  Alexandria Hayes is a 78 y.o. 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    The following portions of the chart were reviewed this encounter and updated as appropriate: medications, allergies, medical history  Review of Systems:  No other skin or systemic complaints except as noted in HPI or Assessment and Plan.  Objective  Well appearing patient in no apparent distress; mood and affect are within normal limits.   A focused examination was performed of the following areas: Right forearm   Relevant exam findings are noted in the Assessment and Plan.  Right forearm Well healed biopsy site  Right Nasal Sidewall Erythematous thin papules/macules with gritty scale.   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 Margins may be involved as not explicitly mentioned in the biopsy report.  Plan: Apply cryotherapy with liquid nitrogen aggressively to the lesion. Instruct patient to apply Aquaphor and keep the area bandaged for 2 weeks. Follow-up in 6 months to assess clearance. If not cleared at follow-up, consider prescribing topical chemotherapy cream. Monitor for flat pink scar as an indication of successful treatment.  Actinic Keratosis Assessment: Actinic keratosis on the right lateral nose persisting for 2 months, not responding to rosacea treatment, indicating a precancerous lesion.  Plan: Apply cryotherapy with liquid nitrogen to the actinic keratosis. Instruct patient to apply Aquaphor to the treated area.  Rosacea Assessment: Previously diagnosed rosacea, currently stable but with associated dryness around the face and eyes.  Plan: Continue current face wash and moisturizer if tolerated. Provided samples of La  Roche-Posay hydrating cleanser for trial. Apply hydrocortisone ointment to eyes for 1 week if breakout occurs. Follow-up annually for rosacea management. Contact clinic for medication refills as needed.  SQUAMOUS CELL CARCINOMA IN SITU Right forearm Destruction of lesion Complexity: simple   Destruction method: cryotherapy   Informed consent: discussed and consent obtained   Timeout:  patient name, date of birth, surgical site, and procedure verified Lesion destroyed using liquid nitrogen: Yes   Region frozen until ice ball extended beyond lesion: Yes   Lesion length (cm):  1 Margin per side (cm):  0.4 Final wound size (cm):  1.8 Outcome: patient tolerated procedure well with no complications   Post-procedure details: wound care instructions given   AK (ACTINIC KERATOSIS) Right Nasal Sidewall Destruction of lesion - Right Nasal Sidewall Complexity: simple   Destruction method: cryotherapy   Informed consent: discussed and consent obtained   Timeout:  patient name, date of birth, surgical site, and procedure verified Lesion destroyed using liquid nitrogen: Yes   Region frozen until ice ball extended beyond lesion: Yes   Outcome: patient tolerated procedure well with no complications   Post-procedure details: wound care instructions given   ROSACEA   Related Medications metroNIDAZOLE (METROCREAM) 0.75 % cream Apply topically 2 (two) times daily.  Return in about 4 months (around 06/30/2023) for SCC in situ follow up , AK follow up.  I, Alexandria Hayes, CMA, am acting as scribe for [employer], DO .   Documentation: I have reviewed the above documentation for accuracy and completeness, and I agree with the above.  Langston Reusing, DO

## 2023-06-15 ENCOUNTER — Ambulatory Visit: Payer: PPO | Admitting: Dermatology

## 2023-06-15 ENCOUNTER — Encounter: Payer: Self-pay | Admitting: Dermatology

## 2023-06-15 VITALS — BP 173/103

## 2023-06-15 DIAGNOSIS — L820 Inflamed seborrheic keratosis: Secondary | ICD-10-CM

## 2023-06-15 DIAGNOSIS — Z86007 Personal history of in-situ neoplasm of skin: Secondary | ICD-10-CM

## 2023-06-15 DIAGNOSIS — L719 Rosacea, unspecified: Secondary | ICD-10-CM

## 2023-06-15 DIAGNOSIS — L910 Hypertrophic scar: Secondary | ICD-10-CM | POA: Diagnosis not present

## 2023-06-15 DIAGNOSIS — Z872 Personal history of diseases of the skin and subcutaneous tissue: Secondary | ICD-10-CM

## 2023-06-15 NOTE — Patient Instructions (Addendum)
 Date: Thu Jun 15 2023  Hello Alexandria Hayes,  Thank you for visiting today. Here is a summary of the key instructions:  - Skin Care:   - Apply Aquaphor or plain Vaseline to all treated spots to help them heal   - Massage prescription STRATADERM scar cream into the sore spot on your right side every day before bed   - Use Avene skincare products for sensitive skin:     - Use the cleanser to wash your face     - Apply the moisturizer after cleansing     - Use Ciculfate healing balm for irritations  - Treatments:   - Cryotherapy was performed on:     - A spot on your left eyebrow     - Two spots on your nose     - A spot on your arm  - Follow-up:   - Return in 6 months for a check-up   - If the sore spot on your right side is still there or grows after 6 months, it may need retreatment  Please reach out if you have any questions or concerns.  Warm regards,  Dr. Louana Roup, Dermatology    Cryotherapy Aftercare  Wash gently with soap and water everyday.   Apply Vaseline and Band-Aid daily until healed.    Important Information  Due to recent changes in healthcare laws, you may see results of your pathology and/or laboratory studies on MyChart before the doctors have had a chance to review them. We understand that in some cases there may be results that are confusing or concerning to you. Please understand that not all results are received at the same time and often the doctors may need to interpret multiple results in order to provide you with the best plan of care or course of treatment. Therefore, we ask that you please give us  2 business days to thoroughly review all your results before contacting the office for clarification. Should we see a critical lab result, you will be contacted sooner.   If You Need Anything After Your Visit  If you have any questions or concerns for your doctor, please call our main line at [phone] If no one answers, please leave a voicemail as  directed and we will return your call as soon as possible. Messages left after 4 pm will be answered the following business day.   You may also send us  a message via MyChart. We typically respond to MyChart messages within 1-2 business days.  For prescription refills, please ask your pharmacy to contact our office. Our fax number is [phone].  If you have an urgent issue when the clinic is closed that cannot wait until the next business day, you can page your doctor at the number below.    Please note that while we do our best to be available for urgent issues outside of office hours, we are not available 24/7.   If you have an urgent issue and are unable to reach us , you may choose to seek medical care at your doctor's office, retail clinic, urgent care center, or emergency room.  If you have a medical emergency, please immediately call 911 or go to the emergency department. In the event of inclement weather, please call our main line at [phone] for an update on the status of any delays or closures.  Dermatology Medication Tips: Please keep the boxes that topical medications come in in order to help keep track of the instructions about where and how  to use these. Pharmacies typically print the medication instructions only on the boxes and not directly on the medication tubes.   If your medication is too expensive, please contact our office at [phone] or send us  a message through MyChart.   We are unable to tell what your co-pay for medications will be in advance as this is different depending on your insurance coverage. However, we may be able to find a substitute medication at lower cost or fill out paperwork to get insurance to cover a needed medication.   If a prior authorization is required to get your medication covered by your insurance company, please allow us  1-2 business days to complete this process.  Drug prices often vary depending on where the prescription is  filled and some pharmacies may offer cheaper prices.  The website www.goodrx.com contains coupons for medications through different pharmacies. The prices here do not account for what the cost may be with help from insurance (it may be cheaper with your insurance), but the website can give you the price if you did not use any insurance.  - You can print the associated coupon and take it with your prescription to the pharmacy.  - You may also stop by our office during regular business hours and pick up a GoodRx coupon card.  - If you need your prescription sent electronically to a different pharmacy, notify our office through [facility] or by phone at [phone]

## 2023-06-15 NOTE — Progress Notes (Signed)
 Follow-Up Visit   Subjective  Alexandria Hayes is a 78 y.o. female who presents for the following: SCC in situ follow up of right forearm treated with LN2. AK of right lateral nose x 2 follow up treated with LN2. She would also like to follow up on her rosacea. She is using metronidazole  0.75% cream at night. She has a lot of little red spots around her eyes but she does not use the cream that close to her eyes. She does say her eyes feel gritty sometimes and when they get irritated she uses allergy  eye drops.She feels that her rosacea flares with heat, stress and carbohydrates. She also has a scaly spot of her left eyebrow.  Accompanied by husband today  The following portions of the chart were reviewed this encounter and updated as appropriate: medications, allergies, medical history  Review of Systems:  No other skin or systemic complaints except as noted in HPI or Assessment and Plan.  Objective  Well appearing patient in no apparent distress; mood and affect are within normal limits.   A focused examination was performed of the following areas: Face, right arm   Relevant exam findings are noted in the Assessment and Plan.       Left Eyebrow x 1, left forearm x 2, right forearm x 2 (5) Erythematous stuck-on, waxy papule or plaque  Assessment & Plan   HISTORY OF SQUAMOUS CELL CARCINOMA IN SITU OF RIGHT FOREARM WITH MILD KELOID FORMATION - No evidence of recurrence today. Recommend massaging Strataderm at bedtime - sample given - Recommend regular full body skin exams - Recommend daily broad spectrum sunscreen SPF 30+ to sun-exposed areas, reapply every 2 hours as needed.  - Call if any new or changing lesions are noted between office visits   HISTORY OF PRECANCEROUS ACTINIC KERATOSIS OF RIGHT LATERAL NOSE - site(s) of PreCancerous Actinic Keratosis clear today. - these may recur and new lesions may form requiring treatment to prevent transformation into skin cancer -  observe for new or changing spots and contact [facility] for appointment if occur - photoprotection with sun protective clothing; sunglasses and broad spectrum sunscreen with SPF of at least 30 + and frequent self skin exams recommended - yearly exams by a dermatologist recommended for persons with history of PreCancerous Actinic Keratoses  Rosacea flare - Assessment:  Patient reports a current flare of rosacea with red bumps. Associated symptoms include watery and blurry eyes. Potential triggers identified include stress, heat exposure, and consumption of carbohydrates (specifically pretzels). Patient has a history of multiple antibiotic allergies, including tetracycline, which limits treatment options.  - Plan:    Provide samples of Avene skincare products for sensitive skin:     - Cleanser     - Moisturizer     - Ciculfate healing balm    Instruct patient to use these products for daily skincare and during flares    Avoid prescription of oral antibiotics (e.g., doxycycline) due to patient's history of antibiotic allergies    Educate patient on trigger avoidance (stress management, heat protection, dietary modifications)    INFLAMED SEBORRHEIC KERATOSIS (5) Left Eyebrow x 1, left forearm x 2, right forearm x 2 (5) Symptomatic, irritating, patient would like treated.  Benign-appearing.  Call clinic for new or changing lesions.   Destruction of lesion - Left Eyebrow x 1, left forearm x 2, right forearm x 2 (5) Complexity: simple   Destruction method: cryotherapy   Informed consent: discussed and consent obtained  Timeout:  patient name, date of birth, surgical site, and procedure verified Lesion destroyed using liquid nitrogen: Yes   Region frozen until ice ball extended beyond lesion: Yes   Outcome: patient tolerated procedure well with no complications   Post-procedure details: wound care instructions given     ROSACEA Exam Mid face erythema with telangiectasias of  face   Treatment Plan Continue Metronidazole  0.75% cream at bedtime.   Recommend Avene Tolerance Control and Cicalfate - samples given. Advised patient Tolerance can be applied in the morning. Advised that it is sterile and should not cause her any reaction. Advised her to do a test spot of the cicalfate to make sure she does not react to it. If no reaction, she can apply this before bed.     Return in about 6 months (around 12/15/2023) for TBSE.  I, Eliot Guernsey, CMA, am acting as scribe for [employer], DO .   Documentation: I have reviewed the above documentation for accuracy and completeness, and I agree with the above.  Louana Roup, DO

## 2023-08-10 ENCOUNTER — Ambulatory Visit: Admitting: Neurology

## 2023-08-10 ENCOUNTER — Encounter: Payer: Self-pay | Admitting: Neurology

## 2023-08-10 VITALS — BP 163/95 | HR 71 | Ht 66.0 in | Wt 236.0 lb

## 2023-08-10 DIAGNOSIS — R519 Headache, unspecified: Secondary | ICD-10-CM | POA: Diagnosis not present

## 2023-08-10 DIAGNOSIS — G8929 Other chronic pain: Secondary | ICD-10-CM

## 2023-08-10 DIAGNOSIS — G43709 Chronic migraine without aura, not intractable, without status migrainosus: Secondary | ICD-10-CM

## 2023-08-10 DIAGNOSIS — R0681 Apnea, not elsewhere classified: Secondary | ICD-10-CM

## 2023-08-10 DIAGNOSIS — G4719 Other hypersomnia: Secondary | ICD-10-CM

## 2023-08-10 DIAGNOSIS — R0683 Snoring: Secondary | ICD-10-CM

## 2023-08-10 DIAGNOSIS — G43109 Migraine with aura, not intractable, without status migrainosus: Secondary | ICD-10-CM

## 2023-08-10 NOTE — Progress Notes (Signed)
 [facility]    Provider:  Dr Ines Requesting Provider: Ofilia Lamar CROME, MD Primary Care Provider:  Ofilia Lamar CROME, MD  CC:  Headaches  HPI:  Alexandria Hayes is a 78 y.o. female here as requested by Ofilia Lamar CROME, MD for migraines. 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); History of cerebellar stroke; Hypercholesterolemia; Hypothyroidism; Laryngopharyngeal reflux; Macular focal retinitis and retinochoroiditis, right; Menopausal problem; Migraine with aura and without status migrainosus, not intractable; Obstruction of left tear duct; Pain in left foot; Posterior vitreous detachment; Rosacea; Encounter for screening mammogram for malignant neoplasm of breast; Seasonal allergic rhinitis; Dyspnea on exertion; Shortness of breath on exertion; Subjective tinnitus of both ears; Systolic murmur; Tendinitis of right rotator cuff; Vaccination refused by patient; and Squamous cell carcinoma in situ (SCCIS) on their problem list. She had a lacunar infarct in the right thalamus in the past.   Migraines started years ago since her 17s all my life, a chiropractor helped with her neck, she has migraines with and without aura and the first was in the 1970s and then she discovered she had fibromyalgia and chronic fatigue and anxiety, she had a stroke and was on xanax around 2006 when she has the stroke, she saw Dr. Gretel in neurology in 2019 who suggested a medication, she had a fall in 2014 and fractured her skull she had a concussion, she gets  numbness on the right side of the face and may be due to her aura or possibly her stroke. Her husband provides much information. About 2 years ago started worsening. And now more frequent and she gets a migraine twice a month for days and feel crappy afterwards but also has more other types of headaches. She wakes herself up snoring, excessively fatigued during the day, morning and nocturnal headaches. Having trouble with memory and words. Migraines with nausea, dizziness, moderate severe, last for days, start with an aura of vision changes, nausea, hurts to move, aura for 20 minutes starts on the right side the aura is in both eyes and the blind spot gets bigger, pulsating/pounding/throbbing, light sensitivity/sound sensitivity, a dark room helps, no medication overuse. Bending down makes her dizzy, positional, strong smells can trigger like a can of tuna fish, weather is also a factor. She does not always have auras she can have migraines without auras. She takes alleve for her migraines. Migraines can last 24-72 hours, she can't drive when she has a migraine due to imbalance, this is affecting her life significantly, she can't make plans with friends, the headaches are more pressure. Getting up in the morning helps, Caffeine helps but makes her jittery. With the headache she feels it is due to er fracture where she cracked her skull in the forehead with pressure. Migraines are unilateral. She can also have aura wihtout migraine 1-3 days with a postdrome lasting a few days.    Reviewed notes, labs and imaging from outside physicians, which showed:  From a thorough review of records and patient report, Medications tried that can be used in migraine/headache management greater than 3 months include: Lifestyle modification, headache diaries, better sleep hygiene, exercise, management of migraine triggers, OTC and  prescribed analgesics/nsaids such as ibuprofen, excedrin, alleve and others, Aimovig contraindicated  due to constipation, amlodipine , gabapentin, carvedilol . Irbesartan, topiramate, amitriptyline, triptans contraindicated due to stroke.   From a thorough review of records and patient report, Medications tried that can be used in migraine/headache management greater than 3 months include: Lifestyle modification, headache diaries, better sleep hygiene, exercise, management of migraine triggers, OTC and prescribed analgesics/nsaids such as ibuprofen, excedrin, alleve and others, Aimovig contraindicated due to constipation   CLINICAL DATA:  Numbness, tingling, and weakness involving the right side of the body. Foot drop. Elevated blood pressure. Prior stroke.   EXAM: reviewed images and agree MRI HEAD WITHOUT CONTRAST   TECHNIQUE: Multiplanar, multiecho pulse sequences of the brain and surrounding structures were obtained without intravenous contrast.   COMPARISON:  Head CT 03/19/2016   FINDINGS: Brain: There is no evidence of acute infarct, intracranial hemorrhage, mass, midline shift, or extra-axial fluid collection. There is mild generalized cerebral atrophy. Patchy T2 hyperintensities in the subcortical and deep cerebral white matter are nonspecific but compatible with moderate chronic small vessel ischemic disease. There is a chronic lacunar infarct in the right thalamus.   Vascular: Major intracranial vascular flow voids are preserved.   Skull and upper cervical spine: Unremarkable bone marrow signal.   Sinuses/Orbits: Bilateral cataract extraction. Small volume left sphenoid sinus secretions. Clear mastoid air cells.   Other: None.   IMPRESSION: 1. No acute intracranial abnormality. 2. Moderate chronic small vessel ischemic disease.   CLINICAL DATA:  Left facial and left arm numbness for 3 days 09/23/2016   EXAM: CT HEAD WITHOUT CONTRAST   TECHNIQUE: Contiguous axial images were obtained from the base of the skull through the vertex without intravenous contrast.    COMPARISON:  MRI 03/19/2016   FINDINGS: Brain: There is atrophy and chronic small vessel disease changes. No acute intracranial abnormality. Specifically, no hemorrhage, hydrocephalus, mass lesion, acute infarction, or significant intracranial injury.   Vascular: No hyperdense vessel or unexpected calcification.   Skull: No acute calvarial abnormality.   Sinuses/Orbits: Visualized paranasal sinuses and mastoids clear. Orbital soft tissues unremarkable.   Other: None   IMPRESSION: No acute intracranial abnormality.   Atrophy, chronic microvascular disease.  Review of Systems: Patient complains of symptoms per HPI as well as the following symptoms None. Pertinent negatives and positives per HPI. All others negative.   Social History   Socioeconomic History   Marital status: Married    Spouse name: Not on file   Number of children: 1   Years of education: 13   Highest education level: Not on file  Occupational History   Occupation: Medical transcriptionist  Tobacco Use   Smoking status: Never    Passive exposure: Never   Smokeless tobacco: Never  Vaping Use   Vaping status: Never Used  Substance and Sexual Activity   Alcohol use: No   Drug use: No   Sexual activity: Not on file  Other Topics Concern   Not on file  Social History Narrative   She is a married mother of one, grandmother of 3 (her current marriage has been for 13 years, so her child was somewhat different marriage).    She lives with her current husband.    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 She did have some College.      She takes when necessary Xanax. Otherwise no drug use.   She does try to walk 20 minutes a day  3 days a week. But this is all from times weather related. She tries to do it but oftentimes will feel too tired. She is limited by leg aching and fatigue as well as FMS -- generalized hurting. &  HA.    Social Drivers of Corporate investment banker Strain: Not on file  Food Insecurity: Low Risk  (05/16/2023)   Received from [facility]   Hunger Vital Sign    Within the past 12 months, you worried that your food would run out before you got money to buy more: Never true    Within the past 12 months, the food you bought just didn't last and you didn't have money to get more. : Never true  Transportation Needs: No Transportation Needs (05/16/2023)   Received from [facility]    In the past 12 months, has lack of reliable transportation kept you from medical appointments, meetings, work or from getting things needed for daily living? : No  Physical Activity: Not on file  Stress: Not on file  Social Connections: Not on file  Intimate Partner Violence: Not on file    Family History  Problem Relation Age of Onset   Breast cancer Mother    Other Father    Other Sister        no Med Hx Noted   Other Brother        No medical history listed   Other Brother        no Med Hx noted   Other Brother        no Med Hx noted   Lung cancer Maternal Grandfather    Stroke Paternal Grandmother    Heart disease Paternal Grandfather        Unsure of details   Migraines Neg Hx     Past Medical History:  Diagnosis Date   Chronic fatigue disorder    Essential hypertension    History of closed head injury 05/2012   Skull fracture and left forehead; related to trauma   Hypothyroid    On replacement therapy   Migraines    Squamous cell carcinoma in situ (SCCIS) 12/21/2022   Right posterior forearm - cryotherapy needed    Squamous cell carcinoma of skin 12/15/2022   Right forearm - in situ treated withLN2   Stroke [facility])     Patient Active Problem List   Diagnosis Date Noted   Squamous cell carcinoma in situ (SCCIS) 12/21/2022   Chest wall pain 12/15/2022   Fibromyalgia 12/15/2022   Rosacea 12/15/2022   Chronic right-sided low back pain without sciatica  05/10/2022   Deviated nasal septum 05/10/2022   Epistaxis 05/10/2022   Obstruction of left tear duct 05/10/2022   Subjective tinnitus of both ears 05/10/2022   Breast asymmetry 03/01/2022   Pain in left foot 04/15/2021   History of cerebellar stroke 11/20/2020   Lumbar pain 08/28/2020   Hallux rigidus, left foot 08/21/2020   Hammertoe of left foot 08/21/2020   Menopausal problem 06/18/2020   Encounter for screening mammogram for malignant neoplasm of breast 06/18/2020   Vaccination refused by patient 12/18/2019   Bilateral carotid artery stenosis 05/22/2019   Epiretinal membrane 05/20/2019   Macular focal retinitis and retinochoroiditis, right 05/20/2019   Posterior vitreous detachment 05/20/2019   Systolic murmur 02/27/2019   Carpal tunnel syndrome of left wrist 05/11/2017   Essential hypertension 09/27/2016   Rapid palpitations 09/27/2016   Leg swelling 09/27/2016   Hyponatremia 09/27/2016   Laryngopharyngeal  reflux 11/08/2015   Tendinitis of right rotator cuff 06/10/2015   Seasonal allergic rhinitis 02/12/2015   Anxiety 02/11/2015   Migraine with aura and without status migrainosus, not intractable 02/11/2015   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     amLODipine  (NORVASC ) 5 MG tablet TAKE ONE TO TWO TABLET BY MOUTH ONCE A WEEK FOR BLOOD  PRESSURE OVER 170/90 240 tablet 2   Ascorbic Acid (VITAMIN C PO) Take 1 tablet by mouth daily.     calcium carbonate (TUMS - DOSED IN MG ELEMENTAL CALCIUM) 500 MG chewable tablet Chew 1 tablet by mouth daily as needed for indigestion or heartburn.      carvedilol  (COREG ) 12.5 MG tablet Take 12.5 mg by mouth 2 (two) times daily with a meal.     Cholecalciferol 125 MCG (5000 UT) capsule Take 5,000 Units by mouth daily.     co-enzyme Q-10 50 MG capsule Take 100 mg by mouth daily.     gabapentin (NEURONTIN) 300 MG capsule Take 300 mg by mouth at bedtime.     irbesartan (AVAPRO) 300 MG tablet Take 150 mg by mouth 2 (two) times daily.     ketoconazole (NIZORAL) 2 % shampoo Apply topically 2 (two) times a week.     Lasmiditan  Succinate (REYVOW ) 100 MG TABS Take 1 tablet (100 mg total) by mouth daily as needed. Do not drive for 8 hours afterwards until you know how medication affects you. Do not take with any other sedating medications. 8 tablet 11   Levothyroxine Sodium 88 MCG CAPS Take 88 mcg by mouth every morning.     MAGNESIUM PO Take 150 mg by mouth daily. Powder Form     metroNIDAZOLE  (METROCREAM ) 0.75 % cream Apply topically 2 (two) times daily. 45 g 5   naproxen sodium (ANAPROX) 220 MG tablet Take 440 mg by mouth daily as needed (pain).     neomycin-polymyxin b-dexamethasone (MAXITROL) 3.5-10000-0.1 OINT SMARTSIG:Sparingly In Eye(s) 4 Times Daily     potassium gluconate (RA POTASSIUM GLUCONATE) 595 (99 K) MG TABS tablet Take by mouth.     Probiotic Product (PROBIOTIC PO) Take 1 tablet by mouth daily.     REPATHA SURECLICK 140 MG/ML SOAJ SMARTSIG:1 Milliliter(s) SUB-Q Every 4 Weeks     No current facility-administered medications for this visit.    Allergies as of 08/10/2023 - Review Complete 08/10/2023  Allergen Reaction Noted   Amlodipine  Rash 01/27/2015   Clonidine Other (See Comments) 01/27/2015   Nitrofuran derivatives Anaphylaxis, Swelling, and Rash 03/19/2016   Triamcinolone  acetonide Anxiety 11/28/2014   Atorvastatin Other (See Comments) 02/27/2019   Simvastatin Other (See Comments) 02/27/2019   Ciprofloxacin Nausea Only 03/19/2016   Cortisone Anxiety and Hypertension 03/19/2016   Erythromycin Rash 03/19/2016   Levaquin [levofloxacin in d5w] Anxiety and Hypertension 03/19/2016   Penicillins Rash 03/19/2016   Prochlorperazine edisylate Anxiety and  Hypertension 01/27/2015   Tetracyclines & related Rash 03/19/2016   Wound dressing adhesive Itching and Rash 01/12/2021    Vitals: BP (!) 163/95   Pulse 71   Ht 5' 6 (1.676 m)   Wt 236 lb (107 kg)   BMI 38.09 kg/m  Last Weight:  Wt Readings from Last 1 Encounters:  08/10/23 236 lb (107 kg)   Last Height:   Ht Readings from Last 1 Encounters:  08/10/23 5' 6 (1.676 m)     Physical exam: Exam: Gen: NAD, conversant, well nourised, obese, well groomed                     CV: RRR, no MRG. No Carotid Bruits. No peripheral edema, warm, nontender Eyes: Conjunctivae clear without exudates or hemorrhage  Neuro: Detailed Neurologic Exam  Speech:    Speech is normal; fluent and spontaneous with normal comprehension.  Cognition:    The patient is oriented to person, place, and time;     recent and remote memory intact;     language fluent;     normal attention, concentration,     fund of knowledge Cranial Nerves:    The pupils are equal, round, and reactive to light. The fundi are normal and spontaneous venous pulsations are present. Visual fields are full to finger confrontation. Extraocular movements are intact. Trigeminal sensation is intact and the muscles of mastication are normal. The face is symmetric. The palate elevates in the midline. Hearing intact. Voice is normal. Shoulder shrug is normal. The tongue has normal motion without fasciculations.   Coordination: Normal  Gait: Normal  Motor Observation:    No asymmetry, no atrophy, and no involuntary movements noted. Tone:    Normal muscle  tone.    Posture:    Posture is normal. normal erect    Strength:    Strength is V/V in the upper and lower limbs.      Sensation: intact to LT     Reflex Exam:  DTR's:    Deep tendon reflexes in the upper and lower extremities are normal bilaterally.   Toes:    The toes are downgoing bilaterally.   Clonus:    Clonus is absent.    Assessment/Plan:  Patient with headaches, PMHx migraines but at this time appears to be more associated with fatigue, memory changes, snoring (wakes herself up snoring), morning headaches (improves when getting up)  Sleep evaluation to Dr. Chalice: She wakes herself up snoring, excessively fatigued during the day, morning and nocturnal headaches Consider Botox for migraines - this would be great because it is not contraindicated with high blood pressure and stroke Try to get Reyvow  for acute management - watch for sedation (this is a new medication for migraines safe in stroke) Discussed MRI of the brain can hold off for right now If wanted to try an older medication(as opposed to the newer cgrp class), Can try Topiramate for migraine prevention but if blood pressure improves with treatment of sleep apnea or other then can consider new better medication Ajovy, Emgality, Ubrelvy, Nurtec(These are contraindicated with uncontrolled HTN)  Orders Placed This Encounter  Procedures   Ambulatory referral to Sleep Studies   Meds ordered this encounter  Medications   Lasmiditan  Succinate (REYVOW ) 100 MG TABS    Sig: Take 1 tablet (100 mg total) by mouth daily as needed. Do not drive for 8 hours afterwards until you know how medication affects you. Do not take with any other sedating medications.  Dispense:  8 tablet    Refill:  11    Cc: Dough, Lamar CROME, MD,  Ofilia, Lamar CROME, MD  Onetha Epp, MD  [facility] [address] [place], [place] [address]  Phone [phone] Fax [phone]

## 2023-08-10 NOTE — Patient Instructions (Addendum)
 Sleep evaluation to Dr. Dohmeier Botox for migraines - will see how much it costs, this would be great because it is fine to take with high blood pressure and stroke Try to get Reyvow for acute management - watch for sedation (this is a new medication for migraines safe in stroke) Discussed MRI of the brain can hold off for right now If wanted to try an older medication, Can try Topiramate for migraine prevention but if blood pressure improves with treatment of sleep apnea then can consider new better medication Alexandria Hayes Newer, Nurtec  Sleep Apnea  Sleep apnea is a condition that affects your breathing while you are sleeping. Your tongue or soft tissue in your throat may block the flow of air while you sleep. You may have shallow breathing or stop breathing for short periods of time. People with sleep apnea may snore loudly. There are three kinds of sleep apnea: Obstructive sleep apnea. This kind is caused by a blocked or collapsed airway. This is the most common. Central sleep apnea. This kind happens when the part of the brain that controls breathing does not send the correct signals to the muscles that control breathing. Mixed sleep apnea. This is a combination of obstructive and central sleep apnea. What are the causes? The most common cause of sleep apnea is a collapsed or blocked airway. What increases the risk? Being very overweight. Having family members with sleep apnea. Having a tongue or tonsils that are larger than normal. Having a small airway or jaw problems. Being older. What are the signs or symptoms? Loud snoring. Restless sleep. Trouble staying asleep. Being sleepy or tired during the day. Waking up gasping or choking. Having a headache in the morning. Mood swings. Having a hard time remembering things and concentrating. How is this diagnosed? A medical history. A physical exam. A sleep study. This is also called a polysomnography test. This test is done  at a sleep lab or in your home while you are sleeping. How is this treated? Treatment may include: Sleeping on your side. Losing weight if you're overweight. Wearing an oral appliance. This is a mouthpiece that moves your lower jaw forward. Using a positive airway pressure (PAP) device to keep your airways open while you sleep, such as: A continuous positive airway pressure (CPAP) device. This device gives forced air through a mask when you breathe out. This keeps your airways open. A bilevel positive airway pressure (BIPAP) device. This device gives forced air through a mask when you breathe in and when you breathe out to keep your airways open. Having surgery if other treatments do not work. If your sleep apnea is not treated, you may be at risk for: Heart failure. Heart attack. Stroke. Type 2 diabetes or a problem with your blood sugar called insulin resistance. Follow these instructions at home: Medicines Take your medicines only as told by your health care provider. Avoid alcohol, medicines to help you relax, and certain pain medicines. These may make sleep apnea worse. General instructions Do not smoke, vape, or use products with nicotine or tobacco in them. If you need help quitting, talk with your provider. If you were given a PAP device to open your airway while you sleep, use it as told by your provider. If you're having surgery, make sure to tell your provider you have sleep apnea. You may need to bring your PAP device with you. Contact a health care provider if: The PAP device that you were given to use during  sleep bothers you or does not seem to be working. You do not feel better or you feel worse. Get help right away if: You have trouble breathing. You have chest pain. You have trouble talking. One side of your body feels weak. A part of your face is hanging down. These symptoms may be an emergency. Call 911 right away. Do not wait to see if the symptoms will go  away. Do not drive yourself to the hospital. This information is not intended to replace advice given to you by your health care provider. Make sure you discuss any questions you have with your health care provider. Document Revised: 09/22/2022 Document Reviewed: 02/24/2022 Elsevier Patient Education  2024 Else[employer] Inc.  Lasmiditan Tablets What is this medication? LASMIDITAN (las MID i tan) treats migraines. It works by blocking pain signals in the brain. It is not used to prevent migraines. This medicine may be used for other purposes; ask your health care provider or pharmacist if you have questions. COMMON BRAND NAME(S): REYVOW What should I tell my care team before I take this medication? They need to know if you have any of these conditions: Heart disease Liver disease An unusual or allergic reaction to lasmiditan, other medications, foods, dyes, or preservatives Pregnant or trying to get pregnant Breast-feeding How should I use this medication? Take this medication by mouth with water. Take it as directed on the label. Do not cut, crush, or chew this medication. Swallow the tablets whole. You can take it with or without food. If it upsets your stomach, take it with food. Do not use it more often than directed. A special MedGuide will be given to you by the pharmacist with each prescription and refill. Be sure to read this information carefully each time. Talk to your care team about the use of this medication in children. Special care may be needed. Overdosage: If you think you have taken too much of this medicine contact a poison control center or emergency room at once. NOTE: This medicine is only for you. Do not share this medicine with others. What if I miss a dose? This does not apply. This medication is not for regular use. It should only be used as needed. What may interact with this medication? This medication may interact with the following: Alcohol Antihistamines for  allergy , cough, and cold Certain medications for anxiety or sleep Certain medications for blood pressure, heart disease, irregular heartbeat Certain medications for depression, anxiety, or other mental health conditions Certain medications for seizures, such as phenobarbital or primidone Dextromethorphan Local anesthetics, such as lidocaine, pramoxine, tetracaine Medications that cause drowsiness before a procedure, such as halothane, isoflurane, methoxyflurane, propofol Medications that relax muscles for surgery Opioids for pain Phenothiazines, such as chlorpromazine, mesoridazine, prochlorperazine, thioridazine St. John's wort This list may not describe all possible interactions. Give your health care provider a list of all the medicines, herbs, non-prescription drugs, or dietary supplements you use. Also tell them if you smoke, drink alcohol, or use illegal drugs. Some items may interact with your medicine. What should I watch for while using this medication? Visit your care team for regular checks on your progress. Tell your care team if your symptoms do not start to get better or if they get worse. This medication may affect your coordination, reaction time, or judgment. Do not drive or operate machinery until you know how this medication affects you. Sit up or stand slowly to reduce the risk of dizzy or fainting spells. Drinking alcohol  with this medication can increase the risk of these side effects. Tell your care team right away if you have any change in your eyesight. What side effects may I notice from receiving this medication? Side effects that you should report to your care team as soon as possible: Allergic reactions--skin rash, itching, hives, swelling of the face, lips, tongue, or throat Irritability, confusion, fast or irregular heartbeat, muscle stiffness, twitching muscles, sweating, high fever, seizure, chills, vomiting, diarrhea, which may be signs of serotonin  syndrome Side effects that usually do not require medical attention (report these to your care team if they continue or are bothersome): Burning or tingling sensation in the hands or feet Dizziness Drowsiness Fatigue This list may not describe all possible side effects. Call your doctor for medical advice about side effects. You may report side effects to FDA at 1-800-FDA-1088. Where should I keep my medication? Keep out of the reach of children and pets. This medication can be abused. Keep it in a safe place to protect it from theft. Do not share it with anyone. It is only for you. Selling or giving away this medication is dangerous and against the law. Store at room temperature between 20 and 25 degrees C (68 and 77 degrees F). Get rid of any unused medication after the expiration date. This medication may cause harm and death if it is taken by other adults, children, or pets. It is important to get rid of the medication as soon as you no longer need it, or it is expired. You can do this in two ways: Take the medication to a medication take-back program. Check with your pharmacy or law enforcement to find a location. If you cannot return the medication, check the label or package insert to see if the medication should be thrown out in the garbage or flushed down the toilet. If you are not sure, ask your care team. If it is safe to put it in the trash, take the medication out of the container. Mix the medication with cat litter, dirt, coffee grounds, or other unwanted substance. Seal the mixture in a bag or container. Put it in the trash. NOTE: This sheet is a summary. It may not cover all possible information. If you have questions about this medicine, talk to your doctor, pharmacist, or health care provider.  2024 Elsevier/Gold Standard (2022-07-08 00:00:00)Topiramate Tablets What is this medication? TOPIRAMATE (toe PYRE a mate) prevents and controls seizures in people with epilepsy. It may also  be used to prevent migraine headaches. It works by calming overactive nerves in your body. This medicine may be used for other purposes; ask your health care provider or pharmacist if you have questions. COMMON BRAND NAME(S): Topamax, Topiragen What should I tell my care team before I take this medication? They need to know if you have any of these conditions: Bleeding disorder Kidney disease Lung disease Suicidal thoughts, plans, or attempt by you or a family member An unusual or allergic reaction to topiramate, other medications, foods, dyes, or preservatives Pregnant or trying to get pregnant Breast-feeding How should I use this medication? Take this medication by mouth with water. Take it as directed on the prescription label at the same time every day. Do not cut, crush or chew this medicine. Swallow the tablets whole. You can take it with or without food. If it upsets your stomach, take it with food. Keep taking it unless your care team tells you to stop. A special MedGuide will be given  to you by the pharmacist with each prescription and refill. Be sure to read this information carefully each time. Talk to your care team about the use of this medication in children. While it may be prescribed for children as young as 2 years for selected conditions, precautions do apply. Overdosage: If you think you have taken too much of this medicine contact a poison control center or emergency room at once. NOTE: This medicine is only for you. Do not share this medicine with others. What if I miss a dose? If you miss a dose, take it as soon as you can unless it is within 6 hours of the next dose. If it is within 6 hours of the next dose, skip the missed dose. Take the next dose at the normal time. Do not take double or extra doses. What may interact with this medication? Acetazolamide Alcohol Antihistamines for allergy , cough, and cold Aspirin and aspirin-like medications Atropine Certain  medications for anxiety or sleep Certain medications for bladder problems, such as oxybutynin, tolterodine Certain medications for depression, such as amitriptyline, fluoxetine, sertraline Certain medications for Parkinson disease, such as benztropine, trihexyphenidyl Certain medications for seizures, such as carbamazepine, lamotrigine, phenobarbital, phenytoin, primidone, valproic acid, zonisamide Certain medications for stomach problems, such as dicyclomine, hyoscyamine Certain medications for travel sickness, such as scopolamine Certain medications that treat or prevent blood clots, such as warfarin, enoxaparin, dalteparin, apixaban, dabigatran, rivaroxaban Digoxin Diltiazem Estrogen and progestin hormones General anesthetics, such as halothane, isoflurane, methoxyflurane, propofol Glyburide Hydrochlorothiazide Ipratropium Lithium Medications that relax muscles Metformin NSAIDs, medications for pain and inflammation, such as ibuprofen or naproxen Opioid medications for pain Phenothiazines, such as chlorpromazine, mesoridazine, prochlorperazine, thioridazine Pioglitazone This list may not describe all possible interactions. Give your health care provider a list of all the medicines, herbs, non-prescription drugs, or dietary supplements you use. Also tell them if you smoke, drink alcohol, or use illegal drugs. Some items may interact with your medicine. What should I watch for while using this medication? Visit your care team for regular checks on your progress. Tell your care team if your symptoms do not start to get better or if they get worse. Do not suddenly stop taking this medication. You may develop a severe reaction. Your care team will tell you how much medication to take. If your care team wants you to stop the medication, the dose may be slowly lowered over time to avoid any side effects. Wear a medical ID bracelet or chain. Carry a card that describes your condition. List the  medications and doses you take on the card. This medication may affect your coordination, reaction time, or judgment. Do not drive or operate machinery until you know how this medication affects you. Sit up or stand slowly to reduce the risk of dizzy or fainting spells. Drinking alcohol with this medication can increase the risk of these side effects. This medication may cause serious skin reactions. They can happen weeks to months after starting the medication. Contact your care team right away if you notice fevers or flu-like symptoms with a rash. The rash may be red or purple and then turn into blisters or peeling of the skin. You may also notice a red rash with swelling of the face, lips, or lymph nodes in your neck or under your arms. This medication may cause thoughts of suicide or depression. This includes sudden changes in mood, behaviors, or thoughts. These changes can happen at any time but are more common in the beginning  of treatment or after a change in dose. Call your care team right away if you experience these thoughts or worsening depression. This medication may slow your child's growth if it is taken for a long time at high doses. Your child's care team will monitor your child's growth. Using this medication for a long time may weaken your bones. The risk of bone fractures may be increased. Talk to your care team about your bone health. Discuss this medication with your care team if you may be pregnant. Serious birth defects can occur if you take this medication during pregnancy. There are benefits and risks to taking medications during pregnancy. Your care team can help you find the option that works for you. Contraception is recommended while taking this medication. Estrogen and progestin hormones may not work as well while you are taking this medication. Your care team can help you find the option that works for you. Talk to your care team before breastfeeding. Changes to your treatment  plan may be needed. What side effects may I notice from receiving this medication? Side effects that you should report to your care team as soon as possible: Allergic reactions--skin rash, itching, hives, swelling of the face, lips, tongue, or throat High acid level--trouble breathing, unusual weakness or fatigue, confusion, headache, fast or irregular heartbeat, nausea, vomiting High ammonia level--unusual weakness or fatigue, confusion, loss of appetite, nausea, vomiting, seizures Fever that does not go away, decrease in sweat Kidney stones--blood in the urine, pain or trouble passing urine, pain in the lower back or sides Redness, blistering, peeling or loosening of the skin, including inside the mouth Sudden eye pain or change in vision such as blurry vision, seeing halos around lights, vision loss Thoughts of suicide or self-harm, worsening mood, feelings of depression Side effects that usually do not require medical attention (report to your care team if they continue or are bothersome): Burning or tingling sensation in hands or feet Difficulty with paying attention, memory, or speech Dizziness Drowsiness Fatigue Loss of appetite with weight loss Slow or sluggish movements of the body This list may not describe all possible side effects. Call your doctor for medical advice about side effects. You may report side effects to FDA at 1-800-FDA-1088. Where should I keep my medication? Keep out of the reach of children and pets. Store between 15 and 30 degrees C (59 and 86 degrees F). Protect from moisture. Keep the container tightly closed. Get rid of any unused medication after the expiration date. To get rid of medications that are no longer needed or have expired: Take the medication to a medication take-back program. Check with your pharmacy or law enforcement to find a location. If you cannot return the medication, check the label or package insert to see if the medication should be  thrown out in the garbage or flushed down the toilet. If you are not sure, ask your care team. If it is safe to put it in the trash, empty the medication out of the container. Mix the medication with cat litter, dirt, coffee grounds, or other unwanted substance. Seal the mixture in a bag or container. Put it in the trash. NOTE: This sheet is a summary. It may not cover all possible information. If you have questions about this medicine, talk to your doctor, pharmacist, or health care provider.  2024 Elsevier/Gold Standard (2021-05-13 00:00:00)

## 2023-08-13 ENCOUNTER — Encounter: Payer: Self-pay | Admitting: Neurology

## 2023-08-13 MED ORDER — REYVOW 100 MG PO TABS
100.0000 mg | ORAL_TABLET | Freq: Every day | ORAL | 11 refills | Status: AC | PRN
Start: 2023-08-13 — End: ?

## 2023-08-15 ENCOUNTER — Other Ambulatory Visit (HOSPITAL_COMMUNITY): Payer: Self-pay

## 2023-08-15 ENCOUNTER — Telehealth: Payer: Self-pay

## 2023-08-15 NOTE — Telephone Encounter (Signed)
 Pharmacy Patient Advocate Encounter   Received notification from Fax that prior authorization for Reyvow  100mg  Tablet is required/requested.   Insurance verification completed.   The patient is insured through [facility] [employer]/RX ADV[employer] .   Per test claim: PA required; PA submitted to above mentioned insurance via Latent Key/confirmation #/[id number] N/A Status is pending

## 2023-08-17 ENCOUNTER — Other Ambulatory Visit (HOSPITAL_COMMUNITY): Payer: Self-pay

## 2023-08-17 NOTE — Telephone Encounter (Signed)
 Pharmacy Patient Advocate Encounter  Received notification from [facility] [employer]/RX [employer] that Prior Authorization for Reyvow  has been APPROVED from 08/15/2023 to 01/03/2024. Ran test claim, Copay is $100.00. This test claim was processed through [facility]- copay amounts may vary at other pharmacies due to pharmacy/plan contracts, or as the patient moves through the different stages of their insurance plan.   PA #/Case ID/Reference #: [id number]

## 2023-08-24 ENCOUNTER — Encounter: Payer: Self-pay | Admitting: Internal Medicine

## 2023-09-07 ENCOUNTER — Institutional Professional Consult (permissible substitution): Admitting: Neurology

## 2023-09-14 ENCOUNTER — Encounter: Payer: Self-pay | Admitting: Neurology

## 2023-09-14 ENCOUNTER — Ambulatory Visit: Admitting: Neurology

## 2023-09-14 VITALS — BP 162/100 | HR 62 | Ht 66.0 in | Wt 234.6 lb

## 2023-09-14 DIAGNOSIS — R0683 Snoring: Secondary | ICD-10-CM | POA: Diagnosis not present

## 2023-09-14 DIAGNOSIS — M2629 Other anomalies of dental arch relationship: Secondary | ICD-10-CM | POA: Insufficient documentation

## 2023-09-14 DIAGNOSIS — G478 Other sleep disorders: Secondary | ICD-10-CM | POA: Insufficient documentation

## 2023-09-14 DIAGNOSIS — G8921 Chronic pain due to trauma: Secondary | ICD-10-CM | POA: Insufficient documentation

## 2023-09-14 DIAGNOSIS — G9332 Myalgic encephalomyelitis/chronic fatigue syndrome: Secondary | ICD-10-CM | POA: Diagnosis not present

## 2023-09-14 MED ORDER — ALPRAZOLAM 0.5 MG PO TABS: 0.5000 mg | ORAL_TABLET | Freq: Every evening | ORAL | 0 refills | Status: AC | PRN
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
## 2023-09-14 NOTE — Addendum Note (Signed)
 Addended by: CHALICE SAUNAS on: 09/14/2023 10:51 AM   Modules accepted: Orders

## 2023-09-14 NOTE — Patient Instructions (Signed)
 Living With Sleep Apnea Sleep apnea is a condition that affects your breathing while you're sleeping. Your tongue or the tissue in your throat may block the flow of air while you sleep. You may have shallow breathing or stop breathing for short periods of time. The breaks in breathing interrupt the deep sleep that you need to feel rested. Even if you don't wake up from the gaps in breathing, you may feel tired during the day. People with sleep apnea may snore loudly. You may have a headache in the morning and feel anxious or depressed. How can sleep apnea affect me? Sleep apnea increases your chances of being very tired during the day. This is called daytime fatigue. Sleep apnea can also increase your risk of: Heart attack. Stroke. Obesity. Type 2 diabetes. Heart failure. Irregular heartbeat. High blood pressure. If you are very tired during the day, you may be more likely to: Not do well in school or at work. Fall asleep while driving. Have trouble paying attention. Develop depression or anxiety. Have problems having sex. This is called sexual dysfunction. What actions can I take to manage sleep apnea? Sleep apnea treatment  If you were given a device to open your airway while you sleep, use it only as told by your health care provider. You may be given: An oral appliance. This is a mouthpiece that shifts your lower jaw forward. A continuous positive airway pressure (CPAP) device. This blows air through a mask. A nasal expiratory positive airway pressure (EPAP) device. This has valves that you put into each nostril. A bi-level positive airway pressure (BIPAP) device. This blows air through a mask when you breathe in and breathe out. You may need surgery if other treatments don't work for you. Sleep habits Go to sleep and wake up at the same time every day. This helps set your internal clock for sleeping. If you stay up later than usual on weekends, try to get up in the morning within 2  hours of the time you usually wake up. Try to get at least 7-9 hours of sleep each night. Stop using a computer, tablet, and mobile phone a few hours before bedtime. Do not take long naps during the day. If you nap, limit it to 30 minutes. Have a relaxing bedtime routine. Reading or listening to music may relax you and help you sleep. Use your bedroom only for sleep. Keep your television and computer out of your bedroom. Keep your bedroom cool, dark, and quiet. Use a supportive mattress and pillows. Follow your provider's instructions for other changes to sleep habits. Nutrition Do not eat big meals in the evening. Do not have caffeine in the later part of the day. The effects of caffeine can last for more than 5 hours. Follow your provider's instructions for any changes to what you eat and drink. Lifestyle Do not drink alcohol before bedtime. Alcohol can cause you to fall asleep at first, but then it can cause you to wake up in the middle of the night and have trouble getting back to sleep. Do not smoke, vape, or use nicotine or tobacco. Medicines Take over-the-counter and prescription medicines only as told by your provider. Do not use over-the-counter sleep medicine. You may become dependent on this medicine, and it can make sleep apnea worse. Do not take medicines, such as sedatives and narcotics, unless told to by your provider. Activity Exercise on most days, but avoid exercising in the evening. Exercising near bedtime can interfere with sleeping.  If possible, spend time outside every day. Natural light helps with your internal clock. General information Lose weight if you need to. Stay at a healthy weight. If you are having surgery, make sure to tell your provider that you have sleep apnea. You may need to bring your device with you. Keep all follow-up visits. Your provider will want to check on your condition. Where to find more information National Heart, Lung, and Blood  Institute: [employer] This information is not intended to replace advice given to you by your health care provider. Make sure you discuss any questions you have with your health care provider. Document Revised: 04/13/2022 Document Reviewed: 04/13/2022 Elsevier Patient Education  2024 [employer].

## 2023-09-18 ENCOUNTER — Telehealth: Payer: Self-pay | Admitting: Neurology

## 2023-09-18 NOTE — Telephone Encounter (Signed)
 [facility]PSG HTA pending

## 2023-09-28 NOTE — Telephone Encounter (Signed)
 [facility]PS[facility] HTA shara: [id number] (exp. 09/18/23 to 12/15/23)

## 2023-09-28 NOTE — Telephone Encounter (Signed)
 I spoke with the patient.  [facility] HTA shara: [id number] (exp. 09/18/23 to 12/15/23)  Patient is scheduled at [facility] for 10/03/23 at 9 pm.  Sent mychart.

## 2023-10-03 ENCOUNTER — Ambulatory Visit (INDEPENDENT_AMBULATORY_CARE_PROVIDER_SITE_OTHER): Admitting: Neurology

## 2023-10-03 DIAGNOSIS — G9332 Myalgic encephalomyelitis/chronic fatigue syndrome: Secondary | ICD-10-CM

## 2023-10-03 DIAGNOSIS — M2629 Other anomalies of dental arch relationship: Secondary | ICD-10-CM

## 2023-10-03 DIAGNOSIS — R0683 Snoring: Secondary | ICD-10-CM | POA: Diagnosis not present

## 2023-10-07 ENCOUNTER — Ambulatory Visit: Payer: Self-pay | Admitting: Neurology

## 2023-10-07 NOTE — Procedures (Signed)
 [facility] POLYSOMNOGRAPHY  INTERPRETATION REPORT   STUDY DATE:  10/03/2023     PATIENT NAME:  Alexandria Hayes         DATE OF BIRTH:  07-09-1945  PATIENT ID:  [id number]    TYPE OF STUDY:  PSG  REFERRED BY: ONETHA EPP, MD  SCORING TECHNICIAN: Jesusa Haddock, RPSGT   HISTORY:  This 77 year-old Female patient was seen on 09-14-2023. Past medical history  : Moya DEZIRE TURK is a 78 y.o. female here as requested by Ofilia Lamar CROME, MD for migraines. Migraines started years ago since her 24s all my life, a chiropractor helped with her neck, she has migraines with and without aura and the first was in the 1970s and then she discovered she had fibromyalgia and chronic fatigue and anxiety, she had a stroke and was on xanax  around 2006 when she has the stroke, she saw Dr. Billy in [facility] neurology in 2019 who suggested a medication, she had a fall in 2014 and fractured her skull she had a concussion, she gets numbness on the right side of the face and may be due to her aura or possibly her stroke. Her husband provides much information. About 2 years ago started worsening, now more frequent and she gets a migraine twice a month which last for days and feels crappy afterwards but also has more types of headaches. She has Fibromyalgia, pain in multiple limbs and joints, right hemiparesis,  palpitations,  PTSD and takes xanax  daily.  She wakes herself up snoring, excessively fatigued during the day, morning and nocturnal headaches. Having trouble with memory and words. Migraines with nausea, dizziness, moderate severe, last for days, start with an aura of vision changes, nausea, hurts to move, aura for 20 minutes   ADDITIONAL INFORMATION:  The Epworth Sleepiness Scale was endorsed at 0 /24 points (scores above or equal to 10 are suggestive of hypersomnolence).   Height: 66 in Weight: 234 lb (BMI 37) Neck Size: 15 in  MEDICATIONS: Xanax , Norvasc , Vitamin C, Tums, Coreg ,  Cholecalciferol, Co Q-10, Neurontin, Avapro, Nizoral, Levothyroxine Sodium, Magnesium, Metrocream , Anaprox, Maxitrol, Probiotic, Repatha, Rey-vow, Potassium TECHNICAL DESCRIPTION: A registered sleep technologist ( RPSGT)  was in attendance for the duration of the recording.  Data collection, scoring, video monitoring, and reporting were performed in compliance with the AASM Manual for the Scoring of Sleep and Associated Events; (Hypopnea is scored based on the criteria listed in Section VIII D. 1b in the AASM Manual V2.6 using a 4% oxygen  desaturation rule or Hypopnea is scored based on the criteria listed in Section VIII D. 1a in the AASM Manual V2.6 using 3% oxygen  desaturation and /or arousal rule).   SLEEP CONTINUITY AND SLEEP ARCHITECTURE:  Lights-out was at 22:07: and lights-on at  05:00:, with  6.9 hours of recording time . Total sleep time ( TST) was 203.5 minutes with a decreased sleep efficiency at 49.2%.  Alexandria  BODY Hayes:  TST was divided  between the following sleep positions: 19.7% supine; non-supine 164 minutes (80%); right 163 minutes (80%), left 00 minutes (0%), and prone 00 minutes (0%).  There was  11.8% REM sleep. Total supine REM sleep time was 00 minutes (0% of total REM sleep). Sleep latency was increased at 60.5 minutes.  REM sleep latency was increased at 252.0 minutes. Of the total sleep time, the percentage of stage N1 sleep was 2.7%, stage N2 sleep was 47%, stage N3 sleep was 38.1%, and REM sleep was 11.8%.  There were 1 Stage R period observed on this study night, 6 awakenings (i.e. transitions to Stage W from any sleep stage), and 29 total stage transitions. Wake after sleep onset (WASO) time accounted for 150 minutes (!).   RESPIRATORY MONITORING:   Based on CMS criteria (using a 4% oxygen  desaturation rule for scoring hypopneas), there were 0 apneas (0 obstructive; 0 central; 0 mixed), and 23 hypopneas.   The Apnea index was 0.0/h. Hypopnea index was 6.8/h. The AHI  (apnea-hypopnea index) was 6.8/h overall (1.5 supine, 33 non-supine; 32.5 in REM, 3.34 in NREM). There were 0 respiratory effort-related arousals (RERAs).  OXIMETRY: Oxyhemoglobin Saturation Nadir during sleep was at  72% from a mean of 93%.  Total sleep time (TST) in  hypoxemia (<89%) was 4.1 minutes, or 2.0% of total sleep time.   LIMB MOVEMENTS: There were 0 periodic limb movements of sleep (0.0/hr). AROUSAL: There were 29 arousals in total, for an arousal index of 9 arousals/hour.  Of these, 17 were identified as respiratory-related arousals (5 /h), 0 were PLM-related arousals (0 /h), and 15 were non-specific arousals (4 /h). EEG:  PSG EEG was of normal amplitude and frequency, with symmetric manifestation of sleep stages. EKG: The electrocardiogram documented  NSR isolated  PVCs.  The average heart rate during sleep was 60 bpm.  The heart rate during sleep varied between a minimum of 50 bpm and  a maximum of  71 bpm. AUDIO and VIDEO: mild to moderate snoring was noted.  IMPRESSION: 1) Sleep disordered breathing was very, very mild and consisted of hypopneas only, clustered during REM sleep. The mild snoring itself did not lead to arousals.  2) Periodic limb movements were not present.   3) Total sleep time was reduced at 203.5 minutes.  Sleep efficiency was decreased at 49.2%.    Sleep fragmentation  was noted- The majority of sleep arousals was related to spontaneous arousals followed by hypopnea related arousals.   RECOMMENDATIONS: I am very hesitant to recommend CPAP therapy for a patient with primary insomnia with an AHI (apnea-hypopnea index) of only 6.8/h overall and REM dependent. I recommend to pursue the optimization of pain therapy and mental health issues to overcome Insomnia.  This mild apnea's treatment is optional -only to be addressed when the patient is able to initiate sleep. Please follow up with primary care and pain are.      DEDRA GORES,  MD

## 2023-10-11 NOTE — Telephone Encounter (Signed)
-----   Message from [place] Dohmeier sent at 10/07/2023  3:07 PM EDT ----- Cc Dr Ofilia.  Patient was referred by Dr Ines for Sleep but will need to follow up with her PCP .  Sleep disordered breathing was very, very mild and consisted of hypopneas only, clustered during REM sleep. The mild snoring itself did not lead to arousals.   I recommend to pursue the optimization of pain therapy and mental health issues to overcome Insomnia.  Please follow up with primary care and pain care.  ----- Message ----- From: Interface, Scanned Link In One Three One Sent: 10/07/2023   3:04 PM EDT To: Dedra Gores, MD

## 2023-10-11 NOTE — Telephone Encounter (Signed)
 Spoke to patient gave sleep study results . Gave Dr Dohmeier recommendations. Pt wanted to know since her oxygen  dropped during REM sleep will extra  oxygen  help her headaches in am  . Will forward to Dr Chalice for review.  Pt expressed understanding and thanked me for calling

## 2023-10-12 NOTE — Telephone Encounter (Signed)
 Spoke to patient gave Dr Dohmeier  recommendation . Pt declined trial cpap due to her being claustrophobic. Pt states will return to PCP

## 2023-10-12 NOTE — Telephone Encounter (Signed)
-----   Message from [place] Dohmeier sent at 10/11/2023  3:41 PM EDT ----- No, she wouldn't qualify for oxygen  based on only a couple of minutes in hypoxia.   All I can offer her is a trial of CPAP , correcting mild apnea and hypoxia. Its optional. If she would be interested, I will offer auto-titration ResMed  CPAP settings from 5-15 cm water, with 2 cm  EPR and mask of choice.   As I indicated, the inability to sleep is a major barrier to treating such mild apnea.  Insomnia needs to be addressed with pain medicine and behavior medicine . ----- Message ----- From: Shona Rojean LABOR, CMA Sent: 10/11/2023   1:47 PM EDT To: Dedra Gores, MD  ----- Message from Rojean LABOR Shona, CMA sent at 10/11/2023  1:47 PM EDT -----  Pt is asking if extra oxygen  during the night will help her headaches in am . Since her o2 dropped during REM sleep during sleep study  ----- Message ----- From: Gores Dedra, MD Sent: 10/07/2023   3:07 PM EDT To: Gna-Pod 3 Results  Cc Dr Ofilia.  Patient was referred by Dr Ines for Sleep but will need to follow up with her PCP .  Sleep disordered breathing was very, very mild and consisted of hypopneas only, clustered during REM sleep. The mild snoring itself did not lead to arousals.   I recommend to pursue the optimization of pain therapy and mental health issues to overcome Insomnia.  Please follow up with primary care and pain care.  ----- Message ----- From: Interface, Scanned Link In One Three One Sent: 10/07/2023   3:04 PM EDT To: Dedra Gores, MD

## 2023-12-07 ENCOUNTER — Encounter: Payer: Self-pay | Admitting: Dermatology

## 2023-12-07 ENCOUNTER — Ambulatory Visit: Admitting: Dermatology

## 2023-12-07 VITALS — BP 175/104

## 2023-12-07 DIAGNOSIS — L814 Other melanin hyperpigmentation: Secondary | ICD-10-CM | POA: Diagnosis not present

## 2023-12-07 DIAGNOSIS — W908XXA Exposure to other nonionizing radiation, initial encounter: Secondary | ICD-10-CM | POA: Diagnosis not present

## 2023-12-07 DIAGNOSIS — L578 Other skin changes due to chronic exposure to nonionizing radiation: Secondary | ICD-10-CM | POA: Diagnosis not present

## 2023-12-07 DIAGNOSIS — Z1283 Encounter for screening for malignant neoplasm of skin: Secondary | ICD-10-CM | POA: Diagnosis not present

## 2023-12-07 DIAGNOSIS — Z86007 Personal history of in-situ neoplasm of skin: Secondary | ICD-10-CM

## 2023-12-07 DIAGNOSIS — L820 Inflamed seborrheic keratosis: Secondary | ICD-10-CM | POA: Diagnosis not present

## 2023-12-07 DIAGNOSIS — D229 Melanocytic nevi, unspecified: Secondary | ICD-10-CM

## 2023-12-07 DIAGNOSIS — D1801 Hemangioma of skin and subcutaneous tissue: Secondary | ICD-10-CM

## 2023-12-07 DIAGNOSIS — L821 Other seborrheic keratosis: Secondary | ICD-10-CM

## 2023-12-07 NOTE — Progress Notes (Unsigned)
   Total Body Skin Exam (TBSE) Visit   Subjective  Alexandria Hayes is a 78 y.o. female ESTABLISHED PATIENT who presents for the following:  Total Body Skin Exam (TBSE)  Patient presents today fr first TBSE.  Patient does have spots of concern to be evaluated (back). She does not apply sunscreen and/or wears protective coverings. Reports Hx of Bx - SCCIS on R posterior forearm Tx with cryosurgery by Dr. Alm on 03/02/23. No family Hx of skin cancers.   The patient has spots, moles and lesions to be evaluated, some may be new or changing and the patient has concerns that these could be cancer.  The following portions of the chart were reviewed this encounter and updated as appropriate: medications, allergies, medical history  Review of Systems:  No other skin or systemic complaints except as noted in HPI or Assessment and Plan.  Objective  Well appearing patient in no apparent distress; mood and affect are within normal limits.  A full examination was performed including scalp, head, eyes, ears, nose, lips, neck, chest, axillae, abdomen, back, buttocks, bilateral upper extremities, bilateral lower extremities, hands, feet, fingers, toes, fingernails, and toenails. All findings within normal limits unless otherwise noted below.   Relevant physical exam findings are noted in the Assessment and Plan.    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  - Call if any new or changing lesions are noted between office visits  LENTIGINES, SEBORRHEIC KERATOSES, HEMANGIOMAS - Benign normal skin lesions - Benign-appearing - Call for any changes  BENIGN MELANOCYTIC NEVI - Tan-brown and/or pink-flesh-colored symmetric macules and papules - Benign  appearing on exam today - Observation - Call clinic for new or changing moles - Recommend daily use of broad spectrum spf 30+ sunscreen to sun-exposed areas.   MILD ACTINIC DAMAGE - Chronic condition, secondary to cumulative UV/sun exposure - diffuse scaly erythematous macules with underlying dyspigmentation - Recommend daily broad spectrum sunscreen SPF 30+ to sun-exposed areas, reapply every 2 hours as needed.  - Staying in the shade or wearing long sleeves, sun glasses (UVA+UVB protection) and wide brim hats (4-inch brim around the entire circumference of the hat) are also recommended for sun protection.  - Call for new or changing lesions.  SKIN CANCER SCREENING PERFORMED TODAY.  INFLAMED SEBORRHEIC KERATOSIS (4) Left Hand - Posterior, Mid Back (2), Right Abdomen (side) - Lower Destruction of lesion - Left Hand - Posterior, Mid Back (2), Right Abdomen (side) - Lower Complexity: simple   Destruction method: cryotherapy   Informed consent: discussed and consent obtained   Timeout:  patient name, date of birth, surgical site, and procedure verified Lesion destroyed using liquid nitrogen: Yes   Region frozen until ice ball extended beyond lesion: Yes   Outcome: patient tolerated procedure well with no complications   Post-procedure details: wound care instructions given    Return in about 6 months (around 06/06/2024) for TBSE.   Documentation: I have reviewed the above documentation for accuracy and completeness, and I agree with the above.  I, Akshith Moncus Maranda, CMA II, am acting as scribe for:  Delon Alm, DO

## 2023-12-07 NOTE — Patient Instructions (Addendum)

## 2024-01-11 ENCOUNTER — Other Ambulatory Visit (HOSPITAL_BASED_OUTPATIENT_CLINIC_OR_DEPARTMENT_OTHER): Payer: Self-pay

## 2024-01-11 MED ORDER — COVID-19 MRNA VAC-TRIS(PFIZER) 30 MCG/0.3ML IM SUSY
PREFILLED_SYRINGE | INTRAMUSCULAR | 0 refills | Status: AC
  Filled 2024-01-11: qty 0.3, 1d supply, fill #0

## 2024-02-07 ENCOUNTER — Ambulatory Visit: Admitting: Neurology

## 2024-02-08 ENCOUNTER — Ambulatory Visit: Admitting: Diagnostic Neuroimaging
